# Patient Record
Sex: Male | Born: 1975 | Race: White | Hispanic: No | Marital: Married | Smoking: Never smoker
Health system: Southern US, Community
[De-identification: ages and names within clinical notes are randomized; demographics above are authoritative.]

## PROBLEM LIST (undated history)

## (undated) HISTORY — PX: CYST EXCISION: SHX5701

---

## 2006-06-25 HISTORY — PX: LAPAROSCOPIC GASTRIC BYPASS: SUR771

## 2007-06-26 DIAGNOSIS — L0291 Cutaneous abscess, unspecified: Secondary | ICD-10-CM

## 2007-06-26 HISTORY — DX: Cutaneous abscess, unspecified: L02.91

## 2010-01-25 ENCOUNTER — Ambulatory Visit (HOSPITAL_COMMUNITY): Admission: AD | Admit: 2010-01-25 | Discharge: 2010-01-26 | Payer: Self-pay | Admitting: Orthopedic Surgery

## 2010-09-08 LAB — BASIC METABOLIC PANEL
BUN: 8 mg/dL (ref 6–23)
CO2: 26 mEq/L (ref 19–32)
Calcium: 8.7 mg/dL (ref 8.4–10.5)
GFR calc non Af Amer: >60 mL/min (ref >60)
Glucose, Bld: 93 mg/dL (ref 70–99)

## 2010-09-08 LAB — CBC
HCT: 38.2 % — ABNORMAL LOW (ref 39.0–52.0)
MCH: 31.1 pg (ref 26.0–34.0)
MCHC: 35.3 g/dL (ref 30.0–36.0)
RDW: 11.9 % (ref 11.5–15.5)

## 2010-09-08 LAB — ANAEROBIC CULTURE

## 2010-09-08 LAB — DIFFERENTIAL
Basophils Absolute: 0 10*3/uL (ref 0.0–0.1)
Basophils Relative: 0 % (ref 0–1)
Eosinophils Absolute: 0.3 10*3/uL (ref 0.0–0.7)
Monocytes Absolute: 1.1 10*3/uL — ABNORMAL HIGH (ref 0.1–1.0)
Monocytes Relative: 11 % (ref 3–12)
Neutro Abs: 6.9 10*3/uL (ref 1.7–7.7)
Neutrophils Relative %: 70 % (ref 43–77)

## 2010-09-08 LAB — WOUND CULTURE

## 2010-09-08 LAB — GRAM STAIN

## 2010-09-08 LAB — SURGICAL PCR SCREEN: Staphylococcus aureus: NEGATIVE

## 2016-10-17 ENCOUNTER — Ambulatory Visit (INDEPENDENT_AMBULATORY_CARE_PROVIDER_SITE_OTHER): Payer: BLUE CROSS/BLUE SHIELD | Admitting: Orthopedic Surgery

## 2016-10-17 ENCOUNTER — Encounter (INDEPENDENT_AMBULATORY_CARE_PROVIDER_SITE_OTHER): Payer: Self-pay | Admitting: Orthopedic Surgery

## 2016-10-17 ENCOUNTER — Ambulatory Visit (INDEPENDENT_AMBULATORY_CARE_PROVIDER_SITE_OTHER): Payer: Self-pay

## 2016-10-17 DIAGNOSIS — M79672 Pain in left foot: Secondary | ICD-10-CM

## 2016-10-17 MED ORDER — DICLOFENAC SODIUM 2 % TD SOLN: 2.0000 | Freq: Two times a day (BID) | TRANSDERMAL | 1 refills | Status: DC

## 2016-10-17 MED ORDER — IBUPROFEN-FAMOTIDINE 800-26.6 MG PO TABS: 1.0000 | ORAL_TABLET | Freq: Two times a day (BID) | ORAL | 0 refills | Status: DC

## 2016-10-17 NOTE — Progress Notes (Signed)
Office Visit Note   Patient: Zachary Bates           Date of Birth: 07-28-1975           MRN: [id number] Visit Date: 10/17/2016 Requested by: No referring provider defined for this encounter. PCP: No PCP Per Patient  Subjective: No chief complaint on file.   HPI: Zachary Bates is a 41 year old patient with left foot pain.  Denies any history of injury.  Pain is been going on for a week.  Reports constant pain is worse with prolonged standing but also affects him when he is at rest.  At times it is tender to touch.  Denies much in the way of swelling.  He does have boots that he wears which are work boots which she has had for a year.  They're high-quality boots.  Put insoles in the boots about 6 months ago.  By taking any medication for the problem yet.  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              ROS: All systems reviewed are negative as they relate to the chief complaint within the history of present illness.  Patient denies  fevers or chills.   Assessment & Plan: Visit Diagnoses:  1. Pain in left foot     Plan: Impression is peroneal tendinitis at the attachment site to the base of fifth metatarsal.  This does not look like a stress reaction or stress fracture at this time based on location of tenderness and plain radiographs.  I would try him on Duexis for [redacted] weeks along with topical anti-inflammatory for about the same amount of time.  We'll see him back in 4 weeks if he is not better could consider MRI scanning at that time to rule out stress fracture  Follow-Up Instructions: No Follow-up on file.   Orders:  Orders Placed This Encounter  Procedures  . XR Foot Complete Left   Meds ordered this encounter  Medications  . Ibuprofen-Famotidine (DUEXIS) 800-26.6 MG TABS    Sig: Take 1 tablet by mouth 2 (two) times daily.    Dispense:  60 tablet    Refill:  0  . Diclofenac Sodium (PENNSAID) 2 % SOLN    Sig: Place 2 Squirts onto the skin 2 (two) times  daily.    Dispense:  1 Bottle    Refill:  1      Procedures: No procedures performed   Clinical Data: No additional findings.  Objective: Vital Signs: There were no vitals taken for this visit.  Physical Exam:   Constitutional: Patient appears well-developed HEENT:  Head: Normocephalic Eyes:EOM are normal Neck: Normal range of motion Cardiovascular: Normal rate Pulmonary/chest: Effort normal Neurologic: Patient is alert Skin: Skin is warm Psychiatric: Patient has normal mood and affect    Ortho Exam: Orthopedic exam demonstrates valgus alignment of the right lower extremity on the left the patient has palpable pedal pulses intact anterior to posterior tib peroneal and Achilles tendon with symmetric tibiotalar subtalar transverse tarsal range of motion.  Patient localizes pain at the base of fifth metatarsal.  This area is tender to palpation.  Peroneal tendon is palpable and intact.  No real discrete tenderness at the bone of the proximal fifth metatarsal itself.  Specialty Comments:  No specialty comments available.  Imaging: Xr Foot Complete Left  Result Date: 10/17/2016 3 views left foot reviewed.  AP lateral and oblique.  No fracture present.  Particularly  at the base of the fifth metatarsal.  No abnormal soft tissue calcifications.  Minimal degenerative changes within the midfoot.  Alignment at the tarsometatarsal articulation is intact    PMFS History: There are no active problems to display for this patient.  No past medical history on file.  No family history on file.  No past surgical history on file. Social History   Occupational History  . Not on file.   Social History Main Topics  . Smoking status: Never Smoker  . Smokeless tobacco: Never Used  . Alcohol use Not on file  . Drug use: Unknown  . Sexual activity: Not on file

## 2016-10-26 ENCOUNTER — Telehealth (INDEPENDENT_AMBULATORY_CARE_PROVIDER_SITE_OTHER): Payer: Self-pay | Admitting: Orthopedic Surgery

## 2016-10-26 NOTE — Telephone Encounter (Signed)
Tresa EndoKelly from [facility] called wanting to know the reason why the patients RX was prescribed. CB # [phone]

## 2016-10-26 NOTE — Telephone Encounter (Signed)
Called back with information

## 2016-11-15 ENCOUNTER — Ambulatory Visit (INDEPENDENT_AMBULATORY_CARE_PROVIDER_SITE_OTHER): Payer: BLUE CROSS/BLUE SHIELD | Admitting: Orthopedic Surgery

## 2016-11-28 ENCOUNTER — Ambulatory Visit (INDEPENDENT_AMBULATORY_CARE_PROVIDER_SITE_OTHER): Payer: BLUE CROSS/BLUE SHIELD | Admitting: Orthopedic Surgery

## 2016-11-28 ENCOUNTER — Encounter (INDEPENDENT_AMBULATORY_CARE_PROVIDER_SITE_OTHER): Payer: Self-pay | Admitting: Orthopedic Surgery

## 2016-11-28 DIAGNOSIS — M79672 Pain in left foot: Secondary | ICD-10-CM

## 2016-11-28 NOTE — Progress Notes (Signed)
   Office Visit Note   Patient: Zachary Bates           Date of Birth: December 30, 1975           MRN: [id number] Visit Date: 11/28/2016 Requested by: No referring provider defined for this encounter. PCP: Patient, No Pcp Per  Subjective: Chief Complaint  Patient presents with  . Left Foot - Follow-up    HPI: Zachary HuaDavid is a 41 year old patient with left foot pain.  Thought he had peroneal tendinitis last clinic visit.  Complains of pain at the end of the day after increased activity but to exercise has helped.  He has kept working.  Steps are okay for him.  He wears safety shoes at work.  He has been on topical anti-inflammatory.              ROS: All systems reviewed are negative as they relate to the chief complaint within the history of present illness.  Patient denies  fevers or chills.   Assessment & Plan: Visit Diagnoses:  1. Left foot pain     Plan: Impression is left foot improvement in peroneal tendon tendinitis.  Plan is to continue current conservative modalities of treatment.  Return as needed  Follow-Up Instructions: No Follow-up on file.   Orders:  No orders of the defined types were placed in this encounter.  No orders of the defined types were placed in this encounter.     Procedures: No procedures performed   Clinical Data: No additional findings.  Objective: Vital Signs: There were no vitals taken for this visit.  Physical Exam:   Constitutional: Patient appears well-developed HEENT:  Head: Normocephalic Eyes:EOM are normal Neck: Normal range of motion Cardiovascular: Normal rate Pulmonary/chest: Effort normal Neurologic: Patient is alert Skin: Skin is warm Psychiatric: Patient has normal mood and affect    Ortho Exam: Orthopedic exam demonstrates slight valgus alignment right leg versus left.  Patient has palpable pedal pulses.  Intact extensor mechanism.  Has good symmetric tibiotalar subtalar transverse tarsal range of motion on the right-hand  side with no tenderness to resisted eversion at the base of the fifth metatarsal.  Specialty Comments:  No specialty comments available.  Imaging: No results found.   PMFS History: There are no active problems to display for this patient.  No past medical history on file.  No family history on file.  No past surgical history on file. Social History   Occupational History  . Not on file.   Social History Main Topics  . Smoking status: Never Smoker  . Smokeless tobacco: Never Used  . Alcohol use Not on file  . Drug use: Unknown  . Sexual activity: Not on file

## 2018-11-21 ENCOUNTER — Ambulatory Visit: Payer: Self-pay | Admitting: Orthopedic Surgery

## 2018-12-05 ENCOUNTER — Telehealth: Payer: Self-pay

## 2018-12-05 ENCOUNTER — Ambulatory Visit: Payer: Self-pay

## 2018-12-05 ENCOUNTER — Encounter: Payer: Self-pay | Admitting: Orthopedic Surgery

## 2018-12-05 ENCOUNTER — Ambulatory Visit (INDEPENDENT_AMBULATORY_CARE_PROVIDER_SITE_OTHER): Payer: BC Managed Care – PPO | Admitting: Orthopedic Surgery

## 2018-12-05 ENCOUNTER — Other Ambulatory Visit: Payer: Self-pay

## 2018-12-05 DIAGNOSIS — M1711 Unilateral primary osteoarthritis, right knee: Secondary | ICD-10-CM | POA: Diagnosis not present

## 2018-12-05 DIAGNOSIS — M25561 Pain in right knee: Secondary | ICD-10-CM | POA: Diagnosis not present

## 2018-12-05 NOTE — Telephone Encounter (Signed)
Submitted VOB for SynviscOne, right knee. 

## 2018-12-05 NOTE — Telephone Encounter (Signed)
Can we get patient approved for right knee gel injection? Thanks.  

## 2018-12-05 NOTE — Telephone Encounter (Signed)
Noted  

## 2018-12-08 ENCOUNTER — Telehealth: Payer: Self-pay

## 2018-12-08 NOTE — Telephone Encounter (Signed)
Patient's insurance, [employer] [place] does not cover any gel injections.  Please advise on what the next option would be for the patient.  Thank you

## 2018-12-08 NOTE — Telephone Encounter (Signed)
IC LM VM for patient advising to return call to discuss options per Dr Marlou Sa.

## 2018-12-08 NOTE — Telephone Encounter (Signed)
Please advise. Thanks.  

## 2018-12-08 NOTE — Telephone Encounter (Signed)
Please call patient to inform him that his insurance does not cover gel injections.  So his options at this point would be living with it or getting the knee replacement

## 2018-12-09 ENCOUNTER — Encounter: Payer: Self-pay | Admitting: Orthopedic Surgery

## 2018-12-09 DIAGNOSIS — M1711 Unilateral primary osteoarthritis, right knee: Secondary | ICD-10-CM | POA: Diagnosis not present

## 2018-12-09 MED ORDER — METHYLPREDNISOLONE ACETATE 40 MG/ML IJ SUSP
40.0000 mg | INTRAMUSCULAR | Status: AC | PRN
  Administered 2018-12-09: 40 mg via INTRA_ARTICULAR

## 2018-12-09 MED ORDER — BUPIVACAINE HCL 0.25 % IJ SOLN
4.0000 mL | INTRAMUSCULAR | Status: AC | PRN
  Administered 2018-12-09: 4 mL via INTRA_ARTICULAR

## 2018-12-09 MED ORDER — LIDOCAINE HCL 1 % IJ SOLN
5.0000 mL | INTRAMUSCULAR | Status: AC | PRN
  Administered 2018-12-09: 5 mL

## 2018-12-09 NOTE — Progress Notes (Signed)
Office Visit Note   Patient: Zachary Bates           Date of Birth: 12/22/1975           MRN: [id number] Visit Date: 12/05/2018 Requested by: No referring provider defined for this encounter. PCP: Patient, No Pcp Per  Subjective: Chief Complaint  Patient presents with  . Right Knee - Pain    HPI: Uziah is a patient with right knee pain.  He reports pain for several months.  He does very difficult work at [employer] where he has to stand for 12-hour shifts.  He had gastric bypass in 2008.  He had a known history of some knee issues at that time.  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  He has had occasional sleep disturbance from the pain.  He describes good and bad days.              ROS: All systems reviewed are negative as they relate to the chief complaint within the history of present illness.  Patient denies  fevers or chills.   Assessment & Plan: Visit Diagnoses:  1. Right knee pain, unspecified chronicity   2. Arthritis of right knee     Plan: Impression is right knee arthritis worsening with valgus deformity.  Valgus deformity is mild to moderate at this time.  He is young but there is really no other option for him other than total knee replacement.  Risk and benefits are discussed.  He is going to consider his options.  We will try to get the gel shot approved for his knee but if that is not in the cards for him then we may need to proceed with total knee replacement.  Cortisone injection performed in the knee today.  6-week return to discuss further options.  Follow-Up Instructions: Return if symptoms worsen or fail to improve.   Orders:  Orders Placed This Encounter  Procedures  . XR Knee 1-2 Views Right   No orders of the defined types were placed in this encounter.     Procedures: Large Joint Inj: R knee on 12/09/2018 9:22 PM Indications: diagnostic evaluation, joint swelling and pain Details: 18 G 1.5 in needle, superolateral approach   Arthrogram: No  Medications: 5 mL lidocaine 1 %; 40 mg methylPREDNISolone acetate 40 MG/ML; 4 mL bupivacaine 0.25 % Outcome: tolerated well, no immediate complications Procedure, treatment alternatives, risks and benefits explained, specific risks discussed. Consent was given by the patient. Immediately prior to procedure a time out was called to verify the correct patient, procedure, equipment, support staff and site/side marked as required. Patient was prepped and draped in the usual sterile fashion.       Clinical Data: No additional findings.  Objective: Vital Signs: There were no vitals taken for this visit.  Physical Exam:   Constitutional: Patient appears well-developed HEENT:  Head: Normocephalic Eyes:EOM are normal Neck: Normal range of motion Cardiovascular: Normal rate Pulmonary/chest: Effort normal Neurologic: Patient is alert Skin: Skin is warm Psychiatric: Patient has normal mood and affect    Ortho Exam: Ortho exam demonstrates full active and passive range of motion of the left knee.  Right knee has valgus alignment with mild effusion.  Pedal pulses palpable.  Patient does achieve full extension and flexion past 90.  No groin pain with internal or external Tatian of the leg.  No other masses lymphadenopathy or skin changes noted in that right knee region  Specialty Comments:  No specialty comments  available.  Imaging: No results found.   PMFS History: There are no active problems to display for this patient.  History reviewed. No pertinent past medical history.  History reviewed. No pertinent family history.  History reviewed. No pertinent surgical history. Social History   Occupational History  . Not on file  Tobacco Use  . Smoking status: Never Smoker  . Smokeless tobacco: Never Used  Substance and Sexual Activity  . Alcohol use: Not on file  . Drug use: Not on file  . Sexual activity: Not on file

## 2019-01-16 ENCOUNTER — Encounter: Payer: Self-pay | Admitting: Orthopedic Surgery

## 2019-01-16 ENCOUNTER — Ambulatory Visit (INDEPENDENT_AMBULATORY_CARE_PROVIDER_SITE_OTHER): Payer: BC Managed Care – PPO | Admitting: Orthopedic Surgery

## 2019-01-16 DIAGNOSIS — M1711 Unilateral primary osteoarthritis, right knee: Secondary | ICD-10-CM | POA: Diagnosis not present

## 2019-01-16 MED ORDER — DICLOFENAC SODIUM 1.5 % TD SOLN: 1.0000 | TRANSDERMAL | 1 refills | Status: DC

## 2019-01-16 NOTE — Progress Notes (Signed)
   Office Visit Note   Patient: Zachary Bates           Date of Birth: 1976-01-22           MRN: [id number] Visit Date: 01/16/2019 Requested by: No referring provider defined for this encounter. PCP: Patient, No Pcp Per  Subjective: Chief Complaint  Patient presents with  . Right Knee - Follow-up    HPI: Zachary Bates is a 43 y.o. male who presents to the office complaining of R knee pain.  Pt is following up after injection on 12/05/18. He states he has had ~50% relief from injection.  He still experiences pain following long 12 hour days at his job, working for [employer].  He has not tried any medications or other treatments besides a knee sleeve and topical NSAID cream with some relief.  His pain does not wake him up from sleep.  Pt states his only medical history is a gastric bypass in the past.                ROS: All systems reviewed are negative as they relate to the chief complaint within the history of present illness.  Patient denies  fevers or chills.   Assessment & Plan: Visit Diagnoses: No diagnosis found.  Plan: Pt is a 43 y.o. Male with a history of R knee pain.  He had some relief from his last injection but it is too soon for another cortisone injection.  The gel injections were denied by insurance.  He is not able to take a course of oral anti-inflammatories due to his history of gastric bypass.  Discussed options with the patient and we agreed on a presciption for Pennsaid topical.  Pt will f/u with the office prn.    Follow-Up Instructions: No follow-ups on file.   Orders:  No orders of the defined types were placed in this encounter.  No orders of the defined types were placed in this encounter.     Procedures: No procedures performed   Clinical Data: No additional findings.  Objective: Vital Signs: There were no vitals taken for this visit.  Physical Exam:   Constitutional: Patient appears well-developed HEENT:  Head: Normocephalic Eyes:EOM are  normal Neck: Normal range of motion Cardiovascular: Normal rate Pulmonary/chest: Effort normal Neurologic: Patient is alert Skin: Skin is warm Psychiatric: Patient has normal mood and affect    Ortho Exam:  Right knee exam Valgus deformity No effusion Extensor mechanism intact No TTP over the medial or lateral jointlines, quad tendon, patellar tendon, pes anserinus, patella, tibial tubercle, LCL/MCL insertions Stable to varus/valgus stresses.  Stable to anterior/posterior drawer Extension to 0 degrees Flexion > 90 degrees   Specialty Comments:  No specialty comments available.  Imaging: No results found.   PMFS History: There are no active problems to display for this patient.  History reviewed. No pertinent past medical history.  History reviewed. No pertinent family history.  History reviewed. No pertinent surgical history. Social History   Occupational History  . Not on file  Tobacco Use  . Smoking status: Never Smoker  . Smokeless tobacco: Never Used  Substance and Sexual Activity  . Alcohol use: Not on file  . Drug use: Not on file  . Sexual activity: Not on file

## 2019-01-19 ENCOUNTER — Encounter: Payer: Self-pay | Admitting: Orthopedic Surgery

## 2020-04-26 ENCOUNTER — Telehealth: Payer: Self-pay

## 2020-04-26 NOTE — Telephone Encounter (Signed)
See below

## 2020-04-26 NOTE — Telephone Encounter (Signed)
Patient wife came in he wants to get pre approval for gel injection for his appt. Which is on 11/12. Call back:[phone]

## 2020-04-27 NOTE — Telephone Encounter (Signed)
Talked with patient's wife Victorino Dike to verify patient's insurance.  Per Victorino Dike, patient still has [employer] and patient's insurance does not cover for gel injections.  Quincy Sheehan and she voiced that she understands.

## 2020-05-06 ENCOUNTER — Ambulatory Visit: Payer: BC Managed Care – PPO | Admitting: Orthopedic Surgery

## 2020-05-06 ENCOUNTER — Ambulatory Visit: Payer: Self-pay

## 2020-05-06 DIAGNOSIS — G8929 Other chronic pain: Secondary | ICD-10-CM

## 2020-05-06 DIAGNOSIS — M25561 Pain in right knee: Secondary | ICD-10-CM

## 2020-05-07 ENCOUNTER — Encounter: Payer: Self-pay | Admitting: Orthopedic Surgery

## 2020-05-07 NOTE — Progress Notes (Signed)
Office Visit Note   Patient: Zachary Bates [facility]           Date of Birth: January 27, 1976           MRN: [id number] Visit Date: 05/06/2020 Requested by: No referring provider defined for this encounter. PCP: Patient, No Pcp Per  Subjective: Chief Complaint  Patient presents with  . Right Knee - Pain    HPI: Robt is a 44 year old patient with end-stage right knee arthritis.  Continues to have pain in his knee.  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  He was considering gel injections but insurance would not cover it.  He also reports having a leg rash since being in the woods.  Initially started out as a ring of small little chigger bite appearing lesions and they spread up the leg.  He also has a cavity which she wants to get treated prior to any type of intervention.  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  Also has a very remote history of MRSA which was by report treated successfully and eradicated at [facility].  His knee pain is debilitating and significant.  Has night pain and rest pain but typically soldiers on.              ROS: All systems reviewed are negative as they relate to the chief complaint within the history of present illness.  Patient denies  fevers or chills.   Assessment & Plan: Visit Diagnoses:  1. Chronic pain of right knee     Plan: Impression is right knee severe arthritis.  Patient has night pain rest pain and pain which interferes with activities of daily living.  We discussed knee replacement as an option.  Even though he is young his quality of life is being affected to the degree that he would like to proceed with knee replacement.  The risk and benefits of knee replacement are discussed include not limited to infection nerve vessel damage potential need for revision in his lifetime.  I would like to attempt press-fit prosthesis but the magnitude of his valgus deformity may require more constraint which is  only available in cemented components.  Patient understands the risk and benefits of surgery.  Do anticipate that we could correct his valgus deformity to  Less than 10 degrees.  Patient understands the risk and benefits and wishes to proceed.  He will undergo medical evaluation for these bites which have persisted since August.  None of them appear infectious but it is slightly atypical that they have persisted for so long.  He also states he has a cavity and I would like to have him get that addressed also before knee replacement.   Follow-Up Instructions: No follow-ups on file.   Orders:  Orders Placed This Encounter  Procedures  . XR KNEE 3 VIEW RIGHT   No orders of the defined types were placed in this encounter.     Procedures: No procedures performed   Clinical Data: No additional findings.  Objective: Vital Signs: There were no vitals taken for this visit.  Physical Exam:  Constitutional: Patient appears well-developed HEENT:  Head: Normocephalic Eyes:EOM are normal Neck: Normal range of motion Cardiovascular: Normal rate Pulmonary/chest: Effort normal Neurologic: Patient is alert Skin: Skin is warm Psychiatric: Patient has normal mood and affect   Ortho Exam: Ortho exam demonstrates valgus alignment right lower extremity.  Does achieve full extension and  flexes to about 100 degrees.  His valgus deformity is minimally correctable in extension and we can correct him about 5 degrees and 30 degrees of flexion.  Extensor mechanism is intact.  Pedal pulses palpable.  Ankle dorsiflexion intact.  Does have on both legs what appears to be multiple trigger bites none of which are acutely inflamed.  They are nontender.  None of them are in line with any type of incision on the right knee.  Specialty Comments:  No specialty comments available.  Imaging: No results found.   PMFS History: There are no problems to display for this patient.  No past medical history on file.   No family history on file.  No past surgical history on file. Social History   Occupational History  . Not on file  Tobacco Use  . Smoking status: Never Smoker  . Smokeless tobacco: Never Used  Substance and Sexual Activity  . Alcohol use: Not on file  . Drug use: Not on file  . Sexual activity: Not on file

## 2020-05-10 ENCOUNTER — Other Ambulatory Visit: Payer: Self-pay

## 2020-05-26 NOTE — Progress Notes (Signed)
[id number] [place], [place] - [address] [place] [address] Phone: [phone] Fax: [phone]  [facility] - [place], [place] - [place] [address] [place] [address] Phone: [phone] Fax: [phone]      Your procedure is scheduled on Tuesday 05/31/2020.  Report to [facility] Main Entrance "A" at 05:30 A.M., and check in at the Admitting office.  Call this number if you have problems the morning of surgery:  [phone]  Call [phone] if you have any questions prior to your surgery date Monday-Friday 8am-4pm    Remember:  Do not eat after midnight the night before your surgery  You may drink clear liquids until 04:30 the morning of your surgery.   Clear liquids allowed are: Water, Non-Citrus Juices (without pulp), Carbonated Beverages, Clear Tea, Black Coffee Only, and Gatorade  Enhanced Recovery after Surgery for Orthopedics Enhanced Recovery after Surgery is a protocol used to improve the stress on your body and your recovery after surgery.  Patient Instructions  . The night before surgery:  o No food after midnight. ONLY clear liquids after midnight  .  Marland Kitchen The day of surgery (if you do NOT have diabetes):  o Drink ONE (1) Pre-Surgery Clear Ensure by 04:30 am the morning of surgery   o This drink was given to you during your hospital  pre-op appointment visit.  o Nothing else to drink after completing the  Pre-Surgery Clear Ensure.         If you have questions, please contact your surgeon's office.     Take these medicines the morning of surgery with A SIP OF WATER: Acetaminophen (Tylenol) - if needed   As of today, STOP taking any Diclofenac Sodium, Aspirin (unless otherwise instructed by your surgeon) or aspirin-containing products, Aleve, Naproxen, Ibuprofen, Motrin, Advil, Goody's, BC's, all herbal medications, fish oil, and all vitamins.                      Do not wear jewelry            Do  not wear lotions, powders, colognes, or deodorant.            Do not shave 48 hours prior to surgery.  Men may shave face and neck.            Do not bring valuables to the hospital.            [facility] is not responsible for any belongings or valuables.  Do NOT Smoke (Tobacco/Vaping) or drink Alcohol 24 hours prior to your procedure  If you use a CPAP at night, you may bring all equipment for your overnight stay.   Contacts, glasses, dentures or bridgework may not be worn into surgery.      For patients admitted to the hospital, discharge time will be determined by your treatment team.   Patients discharged the day of surgery will not be allowed to drive home, and someone needs to stay with them for 24 hours.    Special instructions:   [place]- Preparing For Surgery  Before surgery, you can play an important role. Because skin is not sterile, your skin needs to be as free of germs as possible. You can reduce the number of germs on your skin by washing with CHG (chlorahexidine gluconate) Soap before surgery.  CHG is an antiseptic cleaner which kills germs and bonds with the skin to continue  killing germs even after washing.    Oral Hygiene is also important to reduce your risk of infection.  Remember - BRUSH YOUR TEETH THE MORNING OF SURGERY WITH YOUR REGULAR TOOTHPASTE  Please do not use if you have an allergy to CHG or antibacterial soaps. If your skin becomes reddened/irritated stop using the CHG.  Do not shave (including legs and underarms) for at least 48 hours prior to first CHG shower. It is OK to shave your face.  Please follow these instructions carefully.   1. Shower the NIGHT BEFORE SURGERY and the MORNING OF SURGERY with CHG Soap.   2. If you chose to wash your hair, wash your hair first as usual with your normal shampoo.  3. After you shampoo, rinse your hair and body thoroughly to remove the shampoo.  4. Use CHG as you would any other liquid soap. You can apply  CHG directly to the skin and wash gently with a scrungie or a clean washcloth.   5. Apply the CHG Soap to your body ONLY FROM THE NECK DOWN.  Do not use on open wounds or open sores. Avoid contact with your eyes, ears, mouth and genitals (private parts). Wash Face and genitals (private parts)  with your normal soap.   6. Wash thoroughly, paying special attention to the area where your surgery will be performed.  7. Thoroughly rinse your body with warm water from the neck down.  8. DO NOT shower/wash with your normal soap after using and rinsing off the CHG Soap.  9. Pat yourself dry with a CLEAN TOWEL.  10. Wear CLEAN PAJAMAS to bed the night before surgery  11. Place CLEAN SHEETS on your bed the night of your first shower and DO NOT SLEEP WITH PETS.   Day of Surgery: Shower with CHG soap as directed Wear Clean/Comfortable clothing the morning of surgery Do not apply any deodorants/lotions.   Remember to brush your teeth WITH YOUR REGULAR TOOTHPASTE.   Please read over the following fact sheets that you were given.

## 2020-05-27 ENCOUNTER — Encounter (HOSPITAL_COMMUNITY)
Admission: RE | Admit: 2020-05-27 | Discharge: 2020-05-27 | Disposition: A | Discharge status: Home / Self Care | Payer: BC Managed Care – PPO | Source: Ambulatory Visit | Attending: Orthopedic Surgery | Admitting: Orthopedic Surgery

## 2020-05-27 ENCOUNTER — Encounter (HOSPITAL_COMMUNITY): Payer: Self-pay

## 2020-05-27 ENCOUNTER — Other Ambulatory Visit: Payer: Self-pay

## 2020-05-27 ENCOUNTER — Other Ambulatory Visit (HOSPITAL_COMMUNITY)
Admission: RE | Admit: 2020-05-27 | Discharge: 2020-05-27 | Disposition: A | Discharge status: Home / Self Care | Payer: BC Managed Care – PPO | Source: Ambulatory Visit | Attending: Orthopedic Surgery | Admitting: Orthopedic Surgery

## 2020-05-27 DIAGNOSIS — Z01812 Encounter for preprocedural laboratory examination: Secondary | ICD-10-CM | POA: Insufficient documentation

## 2020-05-27 DIAGNOSIS — Z20822 Contact with and (suspected) exposure to covid-19: Secondary | ICD-10-CM | POA: Diagnosis not present

## 2020-05-27 LAB — CBC
HCT: 45.9 % (ref 39.0–52.0)
Hemoglobin: 14.9 g/dL (ref 13.0–17.0)
MCH: 29.5 pg (ref 26.0–34.0)
MCHC: 32.5 g/dL (ref 30.0–36.0)
MCV: 90.9 fL (ref 80.0–100.0)
Platelets: 273 10*3/uL (ref 150–400)
RBC: 5.05 MIL/uL (ref 4.22–5.81)
RDW: 12.6 % (ref 11.5–15.5)
WBC: 6.7 10*3/uL (ref 4.0–10.5)
nRBC: 0 % (ref 0.0–0.2)

## 2020-05-27 LAB — BASIC METABOLIC PANEL
Anion gap: 8 (ref 5–15)
BUN: 11 mg/dL (ref 6–20)
CO2: 28 mmol/L (ref 22–32)
Calcium: 9.2 mg/dL (ref 8.9–10.3)
Chloride: 102 mmol/L (ref 98–111)
Creatinine, Ser: 0.79 mg/dL (ref 0.61–1.24)
GFR, Estimated: >60 mL/min (ref >60)
Glucose, Bld: 89 mg/dL (ref 70–99)
Potassium: 4.8 mmol/L (ref 3.5–5.1)
Sodium: 138 mmol/L (ref 135–145)

## 2020-05-27 LAB — URINALYSIS, ROUTINE W REFLEX MICROSCOPIC
Bilirubin Urine: NEGATIVE
Glucose, UA: NEGATIVE mg/dL
Hgb urine dipstick: NEGATIVE
Ketones, ur: NEGATIVE mg/dL
Leukocytes,Ua: NEGATIVE
Nitrite: NEGATIVE
Protein, ur: NEGATIVE mg/dL
Specific Gravity, Urine: 1.016 (ref 1.005–1.030)
pH: 7 (ref 5.0–8.0)

## 2020-05-27 LAB — SARS CORONAVIRUS 2 (TAT 6-24 HRS): SARS Coronavirus 2: NEGATIVE

## 2020-05-27 LAB — SURGICAL PCR SCREEN
MRSA, PCR: NEGATIVE
Staphylococcus aureus: NEGATIVE

## 2020-05-27 NOTE — Progress Notes (Signed)
PCP -  Judeen Hammans FNP in [place], [place] Cardiologist - Denies  Chest x-ray -Not indicated  EKG - Not indicated Stress Test - Denies ECHO - Denies Cardiac Cath - Denies  Sleep Study - Years ago, diagnosed with OSA then lost weight no longer uses CPAP or has trouble CPAP - NO  DM - Denies  ERAS Protcol -Yes PRE-SURGERY Ensure given   COVID TEST- 05/27/20  Anesthesia review: No  Patient denies shortness of breath, fever, cough and chest pain at PAT appointment   All instructions explained to the patient, with a verbal understanding of the material. Patient agrees to go over the instructions while at home for a better understanding. Patient also instructed to self quarantine after being tested for COVID-19. The opportunity to ask questions was provided.

## 2020-05-28 ENCOUNTER — Other Ambulatory Visit (HOSPITAL_COMMUNITY): Payer: BC Managed Care – PPO

## 2020-05-29 LAB — URINE CULTURE: Culture: NO GROWTH

## 2020-05-30 MED ORDER — TRANEXAMIC ACID 1000 MG/10ML IV SOLN
2000.0000 mg | INTRAVENOUS | Status: AC
  Administered 2020-05-31: 2000 mg via TOPICAL
  Filled 2020-05-30: qty 20

## 2020-05-30 NOTE — Anesthesia Preprocedure Evaluation (Addendum)
Anesthesia Evaluation  Patient identified by MRN, date of birth, ID band Patient awake    Reviewed: Allergy & Precautions, NPO status , Patient's Chart, lab work & pertinent test results  History of Anesthesia Complications Negative for: history of anesthetic complications  Airway Mallampati: II  TM Distance: >3 FB Neck ROM: Full    Dental  (+) Dental Advisory Given   Pulmonary neg pulmonary ROS,  05/27/2020 SARS coronavirus NEG   breath sounds clear to auscultation       Cardiovascular negative cardio ROS   Rhythm:Regular Rate:Normal     Neuro/Psych negative neurological ROS     GI/Hepatic Neg liver ROS, neg GERD  ,H/o gastric bypass   Endo/Other  Morbid obesity  Renal/GU negative Renal ROS     Musculoskeletal  (+) Arthritis ,   Abdominal (+) + obese,   Peds  Hematology negative hematology ROS (+)   Anesthesia Other Findings   Reproductive/Obstetrics                            Anesthesia Physical Anesthesia Plan  ASA: II  Anesthesia Plan: Spinal   Post-op Pain Management:  Regional for Post-op pain   Induction:   PONV Risk Score and Plan: 1 and Ondansetron  Airway Management Planned: Natural Airway and Simple Face Mask  Additional Equipment: None  Intra-op Plan:   Post-operative Plan:   Informed Consent: I have reviewed the patients History and Physical, chart, labs and discussed the procedure including the risks, benefits and alternatives for the proposed anesthesia with the patient or authorized representative who has indicated his/her understanding and acceptance.     Dental advisory given  Plan Discussed with: Surgeon and CRNA  Anesthesia Plan Comments: (Plan routine monitors, SAB with adductor canal block for post op analgesia)       Anesthesia Quick Evaluation

## 2020-05-31 ENCOUNTER — Observation Stay (HOSPITAL_COMMUNITY)
Admission: RE | Admit: 2020-05-31 | Discharge: 2020-06-02 | Disposition: A | Discharge status: Home / Self Care | Payer: BC Managed Care – PPO | Attending: Orthopedic Surgery | Admitting: Orthopedic Surgery

## 2020-05-31 ENCOUNTER — Encounter (HOSPITAL_COMMUNITY)
Admission: RE | Disposition: A | Discharge status: Home / Self Care | Payer: Self-pay | Source: Home / Self Care | Attending: Orthopedic Surgery

## 2020-05-31 ENCOUNTER — Encounter (HOSPITAL_COMMUNITY): Payer: Self-pay | Admitting: Orthopedic Surgery

## 2020-05-31 ENCOUNTER — Ambulatory Visit (HOSPITAL_COMMUNITY): Payer: BC Managed Care – PPO | Admitting: Certified Registered Nurse Anesthetist

## 2020-05-31 ENCOUNTER — Observation Stay (HOSPITAL_COMMUNITY): Payer: BC Managed Care – PPO

## 2020-05-31 ENCOUNTER — Other Ambulatory Visit: Payer: Self-pay

## 2020-05-31 DIAGNOSIS — M1711 Unilateral primary osteoarthritis, right knee: Secondary | ICD-10-CM | POA: Diagnosis not present

## 2020-05-31 DIAGNOSIS — Z96651 Presence of right artificial knee joint: Secondary | ICD-10-CM

## 2020-05-31 HISTORY — PX: TOTAL KNEE ARTHROPLASTY: SHX125

## 2020-05-31 SURGERY — ARTHROPLASTY, KNEE, TOTAL
Anesthesia: Spinal | Site: Knee | Laterality: Right

## 2020-05-31 MED ORDER — METHOCARBAMOL 500 MG PO TABS
ORAL_TABLET | ORAL | Status: AC
  Filled 2020-05-31: qty 1

## 2020-05-31 MED ORDER — POVIDONE-IODINE 7.5 % EX SOLN
Freq: Once | CUTANEOUS | Status: DC
  Filled 2020-05-31: qty 118

## 2020-05-31 MED ORDER — OXYCODONE HCL 5 MG PO TABS
5.0000 mg | ORAL_TABLET | Freq: Once | ORAL | Status: AC | PRN
  Administered 2020-05-31: 5 mg via ORAL

## 2020-05-31 MED ORDER — ONDANSETRON HCL 4 MG/2ML IJ SOLN
INTRAMUSCULAR | Status: DC | PRN
  Administered 2020-05-31: 4 mg via INTRAVENOUS

## 2020-05-31 MED ORDER — BUPIVACAINE IN DEXTROSE 0.75-8.25 % IT SOLN
INTRATHECAL | Status: DC | PRN
  Administered 2020-05-31: 15 mg via INTRATHECAL

## 2020-05-31 MED ORDER — CEFAZOLIN SODIUM-DEXTROSE 2-4 GM/100ML-% IV SOLN
2.0000 g | Freq: Three times a day (TID) | INTRAVENOUS | Status: AC
  Administered 2020-05-31 (×2): 2 g via INTRAVENOUS
  Filled 2020-05-31 (×2): qty 100

## 2020-05-31 MED ORDER — PROPOFOL 10 MG/ML IV BOLUS
INTRAVENOUS | Status: AC
  Filled 2020-05-31: qty 20

## 2020-05-31 MED ORDER — BUPIVACAINE HCL 0.25 % IJ SOLN
INTRAMUSCULAR | Status: DC | PRN
  Administered 2020-05-31: 30 mL

## 2020-05-31 MED ORDER — ACETAMINOPHEN 500 MG PO TABS
1000.0000 mg | ORAL_TABLET | Freq: Once | ORAL | Status: DC
  Filled 2020-05-31: qty 2

## 2020-05-31 MED ORDER — POVIDONE-IODINE 10 % EX SWAB: 2.0000 "application " | Freq: Once | CUTANEOUS | Status: DC

## 2020-05-31 MED ORDER — BUPIVACAINE LIPOSOME 1.3 % IJ SUSP
20.0000 mL | INTRAMUSCULAR | Status: AC
  Administered 2020-05-31: 20 mL
  Filled 2020-05-31: qty 20

## 2020-05-31 MED ORDER — PROMETHAZINE HCL 25 MG/ML IJ SOLN: 6.2500 mg | INTRAMUSCULAR | Status: DC | PRN

## 2020-05-31 MED ORDER — MENTHOL 3 MG MT LOZG: 1.0000 | LOZENGE | OROMUCOSAL | Status: DC | PRN

## 2020-05-31 MED ORDER — PHENYLEPHRINE HCL-NACL 10-0.9 MG/250ML-% IV SOLN
INTRAVENOUS | Status: DC | PRN
  Administered 2020-05-31: 20 ug/min via INTRAVENOUS

## 2020-05-31 MED ORDER — CLONIDINE HCL (ANALGESIA) 100 MCG/ML EP SOLN
EPIDURAL | Status: AC
  Filled 2020-05-31: qty 10

## 2020-05-31 MED ORDER — ASPIRIN 81 MG PO CHEW
81.0000 mg | CHEWABLE_TABLET | Freq: Two times a day (BID) | ORAL | Status: DC
  Administered 2020-05-31 – 2020-06-02 (×4): 81 mg via ORAL
  Filled 2020-05-31 (×4): qty 1

## 2020-05-31 MED ORDER — TRANEXAMIC ACID-NACL 1000-0.7 MG/100ML-% IV SOLN
1000.0000 mg | INTRAVENOUS | Status: AC
  Administered 2020-05-31: 1000 mg via INTRAVENOUS
  Filled 2020-05-31: qty 100

## 2020-05-31 MED ORDER — LACTATED RINGERS IV SOLN: INTRAVENOUS | Status: DC

## 2020-05-31 MED ORDER — CLONIDINE HCL (ANALGESIA) 100 MCG/ML EP SOLN
EPIDURAL | Status: DC | PRN
  Administered 2020-05-31: 100 ug

## 2020-05-31 MED ORDER — METHOCARBAMOL 500 MG PO TABS
500.0000 mg | ORAL_TABLET | Freq: Four times a day (QID) | ORAL | Status: DC | PRN
  Administered 2020-05-31 – 2020-06-02 (×6): 500 mg via ORAL
  Filled 2020-05-31 (×5): qty 1

## 2020-05-31 MED ORDER — FENTANYL CITRATE (PF) 250 MCG/5ML IJ SOLN
INTRAMUSCULAR | Status: DC | PRN
  Administered 2020-05-31: 50 ug via INTRAVENOUS
  Administered 2020-05-31: 25 ug via INTRAVENOUS

## 2020-05-31 MED ORDER — OXYCODONE HCL 5 MG PO TABS
ORAL_TABLET | ORAL | Status: AC
  Filled 2020-05-31: qty 1

## 2020-05-31 MED ORDER — VANCOMYCIN HCL 1000 MG IV SOLR
INTRAVENOUS | Status: AC
  Filled 2020-05-31: qty 1000

## 2020-05-31 MED ORDER — BUPIVACAINE HCL (PF) 0.25 % IJ SOLN
INTRAMUSCULAR | Status: AC
  Filled 2020-05-31: qty 30

## 2020-05-31 MED ORDER — PROPOFOL 500 MG/50ML IV EMUL
INTRAVENOUS | Status: DC | PRN
  Administered 2020-05-31: 50 ug/kg/min via INTRAVENOUS

## 2020-05-31 MED ORDER — MEPERIDINE HCL 25 MG/ML IJ SOLN: 6.2500 mg | INTRAMUSCULAR | Status: DC | PRN

## 2020-05-31 MED ORDER — LACTATED RINGERS IV SOLN: INTRAVENOUS | Status: DC | PRN

## 2020-05-31 MED ORDER — ROPIVACAINE HCL 7.5 MG/ML IJ SOLN
INTRAMUSCULAR | Status: DC | PRN
  Administered 2020-05-31: 20 mL via PERINEURAL

## 2020-05-31 MED ORDER — CHLORHEXIDINE GLUCONATE 0.12 % MT SOLN
OROMUCOSAL | Status: AC
  Administered 2020-05-31: 15 mL
  Filled 2020-05-31: qty 15

## 2020-05-31 MED ORDER — OXYCODONE HCL 5 MG PO TABS
5.0000 mg | ORAL_TABLET | ORAL | Status: DC | PRN
  Administered 2020-05-31 – 2020-06-02 (×7): 10 mg via ORAL
  Administered 2020-06-02: 5 mg via ORAL
  Administered 2020-06-02 (×2): 10 mg via ORAL
  Filled 2020-05-31 (×11): qty 2

## 2020-05-31 MED ORDER — OXYCODONE HCL 5 MG/5ML PO SOLN: 5.0000 mg | Freq: Once | ORAL | Status: AC | PRN

## 2020-05-31 MED ORDER — CELECOXIB 200 MG PO CAPS
200.0000 mg | ORAL_CAPSULE | Freq: Two times a day (BID) | ORAL | Status: DC
  Administered 2020-05-31 – 2020-06-02 (×4): 200 mg via ORAL
  Filled 2020-05-31 (×4): qty 1

## 2020-05-31 MED ORDER — SODIUM CHLORIDE 0.9% FLUSH
INTRAVENOUS | Status: DC | PRN
  Administered 2020-05-31 (×2): 10 mL

## 2020-05-31 MED ORDER — ONDANSETRON HCL 4 MG/2ML IJ SOLN: 4.0000 mg | Freq: Four times a day (QID) | INTRAMUSCULAR | Status: DC | PRN

## 2020-05-31 MED ORDER — PHENOL 1.4 % MT LIQD: 1.0000 | OROMUCOSAL | Status: DC | PRN

## 2020-05-31 MED ORDER — LIDOCAINE 2% (20 MG/ML) 5 ML SYRINGE
INTRAMUSCULAR | Status: DC | PRN
  Administered 2020-05-31: 80 mg via INTRAVENOUS

## 2020-05-31 MED ORDER — HYDROMORPHONE HCL 1 MG/ML IJ SOLN: 0.2500 mg | INTRAMUSCULAR | Status: DC | PRN

## 2020-05-31 MED ORDER — MORPHINE SULFATE (PF) 4 MG/ML IV SOLN
INTRAVENOUS | Status: DC | PRN
  Administered 2020-05-31: 4 mg

## 2020-05-31 MED ORDER — SODIUM CHLORIDE 0.9 % IR SOLN
Status: DC | PRN
  Administered 2020-05-31: 3000 mL

## 2020-05-31 MED ORDER — DOCUSATE SODIUM 100 MG PO CAPS
100.0000 mg | ORAL_CAPSULE | Freq: Two times a day (BID) | ORAL | Status: DC
  Administered 2020-05-31 – 2020-06-02 (×4): 100 mg via ORAL
  Filled 2020-05-31 (×4): qty 1

## 2020-05-31 MED ORDER — METOCLOPRAMIDE HCL 5 MG/ML IJ SOLN: 5.0000 mg | Freq: Three times a day (TID) | INTRAMUSCULAR | Status: DC | PRN

## 2020-05-31 MED ORDER — ACETAMINOPHEN 325 MG PO TABS: 325.0000 mg | ORAL_TABLET | Freq: Four times a day (QID) | ORAL | Status: DC | PRN

## 2020-05-31 MED ORDER — CEFAZOLIN SODIUM-DEXTROSE 2-4 GM/100ML-% IV SOLN
2.0000 g | INTRAVENOUS | Status: AC
  Administered 2020-05-31: 2 g via INTRAVENOUS
  Filled 2020-05-31: qty 100

## 2020-05-31 MED ORDER — MIDAZOLAM HCL 2 MG/2ML IJ SOLN: 0.5000 mg | Freq: Once | INTRAMUSCULAR | Status: DC | PRN

## 2020-05-31 MED ORDER — ONDANSETRON HCL 4 MG PO TABS: 4.0000 mg | ORAL_TABLET | Freq: Four times a day (QID) | ORAL | Status: DC | PRN

## 2020-05-31 MED ORDER — METHOCARBAMOL 1000 MG/10ML IJ SOLN
500.0000 mg | Freq: Four times a day (QID) | INTRAVENOUS | Status: DC | PRN
  Filled 2020-05-31: qty 5

## 2020-05-31 MED ORDER — METOCLOPRAMIDE HCL 5 MG PO TABS: 5.0000 mg | ORAL_TABLET | Freq: Three times a day (TID) | ORAL | Status: DC | PRN

## 2020-05-31 MED ORDER — 0.9 % SODIUM CHLORIDE (POUR BTL) OPTIME
TOPICAL | Status: DC | PRN
  Administered 2020-05-31: 1000 mL

## 2020-05-31 MED ORDER — MIDAZOLAM HCL 5 MG/5ML IJ SOLN
INTRAMUSCULAR | Status: DC | PRN
  Administered 2020-05-31: 2 mg via INTRAVENOUS

## 2020-05-31 MED ORDER — IRRISEPT - 450ML BOTTLE WITH 0.05% CHG IN STERILE WATER, USP 99.95% OPTIME
TOPICAL | Status: DC | PRN
  Administered 2020-05-31: 450 mL via TOPICAL

## 2020-05-31 MED ORDER — MORPHINE SULFATE (PF) 4 MG/ML IV SOLN
INTRAVENOUS | Status: AC
  Filled 2020-05-31: qty 2

## 2020-05-31 MED ORDER — FENTANYL CITRATE (PF) 250 MCG/5ML IJ SOLN
INTRAMUSCULAR | Status: AC
  Filled 2020-05-31: qty 5

## 2020-05-31 MED ORDER — HYDROMORPHONE HCL 1 MG/ML IJ SOLN
0.5000 mg | INTRAMUSCULAR | Status: DC | PRN
  Administered 2020-05-31 – 2020-06-01 (×5): 0.5 mg via INTRAVENOUS
  Filled 2020-05-31 (×6): qty 0.5

## 2020-05-31 MED ORDER — MIDAZOLAM HCL 2 MG/2ML IJ SOLN
INTRAMUSCULAR | Status: AC
  Filled 2020-05-31: qty 2

## 2020-05-31 SURGICAL SUPPLY — 84 items
BAG DECANTER FOR FLEXI CONT (MISCELLANEOUS) ×3 IMPLANT
BANDAGE ESMARK 6X9 LF (GAUZE/BANDAGES/DRESSINGS) ×1 IMPLANT
BLADE SAG 18X100X1.27 (BLADE) ×3 IMPLANT
BNDG COHESIVE 6X5 TAN STRL LF (GAUZE/BANDAGES/DRESSINGS) ×3 IMPLANT
BNDG ELASTIC 6X10 VLCR STRL LF (GAUZE/BANDAGES/DRESSINGS) ×3 IMPLANT
BNDG ELASTIC 6X15 VLCR STRL LF (GAUZE/BANDAGES/DRESSINGS) ×3 IMPLANT
BNDG ESMARK 6X9 LF (GAUZE/BANDAGES/DRESSINGS) ×3
BOWL SMART MIX CTS (DISPOSABLE) IMPLANT
CLOSURE STERI-STRIP 1/2X4 (GAUZE/BANDAGES/DRESSINGS) ×1
CLOSURE WOUND 1/2 X4 (GAUZE/BANDAGES/DRESSINGS) ×2
CLSR STERI-STRIP ANTIMIC 1/2X4 (GAUZE/BANDAGES/DRESSINGS) ×2 IMPLANT
CNTNR URN SCR LID CUP LEK RST (MISCELLANEOUS) ×1 IMPLANT
COMP FEM POR CR SZ 10 RT (Joint) ×3 IMPLANT
COMP PATELLAR 10X35 METAL (Joint) ×3 IMPLANT
COMPONENT FEM POR CR SZ 10 RT (Joint) ×1 IMPLANT
COMPONENT PATELLAR 10X35 METAL (Joint) ×1 IMPLANT
CONT SPEC 4OZ STRL OR WHT (MISCELLANEOUS) ×2
COVER SURGICAL LIGHT HANDLE (MISCELLANEOUS) ×3 IMPLANT
COVER WAND RF STERILE (DRAPES) ×3 IMPLANT
CUFF TOURN SGL QUICK 34 (TOURNIQUET CUFF) ×2
CUFF TOURN SGL QUICK 42 (TOURNIQUET CUFF) IMPLANT
CUFF TRNQT CYL 34X4.125X (TOURNIQUET CUFF) ×1 IMPLANT
DECANTER SPIKE VIAL GLASS SM (MISCELLANEOUS) ×3 IMPLANT
DRAPE INCISE IOBAN 66X45 STRL (DRAPES) IMPLANT
DRAPE ORTHO SPLIT 77X108 STRL (DRAPES) ×6
DRAPE SURG ORHT 6 SPLT 77X108 (DRAPES) ×3 IMPLANT
DRAPE U-SHAPE 47X51 STRL (DRAPES) ×3 IMPLANT
DRSG AQUACEL AG ADV 3.5X14 (GAUZE/BANDAGES/DRESSINGS) IMPLANT
DURAPREP 26ML APPLICATOR (WOUND CARE) ×6 IMPLANT
ELECT CAUTERY BLADE 6.4 (BLADE) ×3 IMPLANT
ELECT REM PT RETURN 9FT ADLT (ELECTROSURGICAL) ×3
ELECTRODE REM PT RTRN 9FT ADLT (ELECTROSURGICAL) ×1 IMPLANT
GAUZE SPONGE 4X4 12PLY STRL (GAUZE/BANDAGES/DRESSINGS) ×3 IMPLANT
GAUZE SPONGE 4X4 16PLY XRAY LF (GAUZE/BANDAGES/DRESSINGS) ×3 IMPLANT
GLOVE BIOGEL PI IND STRL 7.0 (GLOVE) ×1 IMPLANT
GLOVE BIOGEL PI IND STRL 8 (GLOVE) ×1 IMPLANT
GLOVE BIOGEL PI INDICATOR 7.0 (GLOVE) ×2
GLOVE BIOGEL PI INDICATOR 8 (GLOVE) ×2
GLOVE ECLIPSE 7.0 STRL STRAW (GLOVE) ×3 IMPLANT
GLOVE ECLIPSE 8.0 STRL XLNG CF (GLOVE) ×3 IMPLANT
GOWN STRL REUS W/ TWL LRG LVL3 (GOWN DISPOSABLE) ×3 IMPLANT
GOWN STRL REUS W/TWL LRG LVL3 (GOWN DISPOSABLE) ×6
HANDPIECE INTERPULSE COAX TIP (DISPOSABLE) ×2
HOOD PEEL AWAY FLYTE STAYCOOL (MISCELLANEOUS) ×9 IMPLANT
IMMOBILIZER KNEE 20 (SOFTGOODS)
IMMOBILIZER KNEE 20 THIGH 36 (SOFTGOODS) IMPLANT
IMMOBILIZER KNEE 22 UNIV (SOFTGOODS) IMPLANT
IMMOBILIZER KNEE 24 THIGH 36 (MISCELLANEOUS) IMPLANT
IMMOBILIZER KNEE 24 UNIV (MISCELLANEOUS)
INSERT TIB PS SZ G RT (Insert) ×1 IMPLANT
INSERT TIBIAL PERSONA 10 RT (Insert) ×1 IMPLANT
INSR TIB PS SZ G RT (Insert) ×3 IMPLANT
KIT BASIN OR (CUSTOM PROCEDURE TRAY) ×3 IMPLANT
KIT TURNOVER KIT B (KITS) ×3 IMPLANT
MANIFOLD NEPTUNE II (INSTRUMENTS) ×3 IMPLANT
NEEDLE 22X1 1/2 (OR ONLY) (NEEDLE) ×6 IMPLANT
NEEDLE SPNL 18GX3.5 QUINCKE PK (NEEDLE) ×3 IMPLANT
NS IRRIG 1000ML POUR BTL (IV SOLUTION) ×6 IMPLANT
PACK TOTAL JOINT (CUSTOM PROCEDURE TRAY) ×3 IMPLANT
PAD ARMBOARD 7.5X6 YLW CONV (MISCELLANEOUS) ×6 IMPLANT
PAD CAST 4YDX4 CTTN HI CHSV (CAST SUPPLIES) ×1 IMPLANT
PADDING CAST ABS 6INX4YD NS (CAST SUPPLIES) ×2
PADDING CAST ABS COTTON 6X4 NS (CAST SUPPLIES) ×1 IMPLANT
PADDING CAST COTTON 4X4 STRL (CAST SUPPLIES) ×2
PADDING CAST COTTON 6X4 STRL (CAST SUPPLIES) ×3 IMPLANT
SET HNDPC FAN SPRY TIP SCT (DISPOSABLE) ×1 IMPLANT
STRIP CLOSURE SKIN 1/2X4 (GAUZE/BANDAGES/DRESSINGS) ×4 IMPLANT
SUCTION FRAZIER HANDLE 10FR (MISCELLANEOUS) ×2
SUCTION TUBE FRAZIER 10FR DISP (MISCELLANEOUS) ×1 IMPLANT
SUT MNCRL AB 3-0 PS2 18 (SUTURE) ×3 IMPLANT
SUT VIC AB 0 CT1 27 (SUTURE) ×6
SUT VIC AB 0 CT1 27XBRD ANBCTR (SUTURE) ×3 IMPLANT
SUT VIC AB 0 CT1 36 (SUTURE) ×3 IMPLANT
SUT VIC AB 1 CT1 27 (SUTURE) ×10
SUT VIC AB 1 CT1 27XBRD ANBCTR (SUTURE) ×5 IMPLANT
SUT VIC AB 2-0 CT1 27 (SUTURE) ×8
SUT VIC AB 2-0 CT1 TAPERPNT 27 (SUTURE) ×4 IMPLANT
SYR 30ML LL (SYRINGE) ×9 IMPLANT
SYR TB 1ML LUER SLIP (SYRINGE) ×3 IMPLANT
TIBIAL INSERT PERSONA 10 RT (Insert) ×3 IMPLANT
TOWEL GREEN STERILE (TOWEL DISPOSABLE) ×6 IMPLANT
TOWEL GREEN STERILE FF (TOWEL DISPOSABLE) ×6 IMPLANT
TRAY CATH 16FR W/PLASTIC CATH (SET/KITS/TRAYS/PACK) IMPLANT
WATER STERILE IRR 1000ML POUR (IV SOLUTION) IMPLANT

## 2020-05-31 NOTE — Evaluation (Signed)
Physical Therapy Evaluation Patient Details Name: Zachary Bates MRN: [id number] DOB: [birth year] Today's Date: 05/31/2020   History of Present Illness  Pt is a 44 y/o male s/p R TKA. Pt with no pertinent PMH.   Clinical Impression  Pt is s/p surgery above with deficits below. Pt with increased pain, so mobility limited. Required min A to sit at EOB. Anticipate pt will progress well once pain controlled. Educated about knee precautions. Will continue to follow acutely.     Follow Up Recommendations Follow surgeon's recommendation for DC plan and follow-up therapies    Equipment Recommendations  None recommended by PT    Recommendations for Other Services       Precautions / Restrictions Precautions Precautions: Knee Precaution Booklet Issued: No Precaution Comments: Verbally reviewed knee precautions with pt.  Restrictions Weight Bearing Restrictions: Yes RLE Weight Bearing: Weight bearing as tolerated      Mobility  Bed Mobility Overal bed mobility: Needs Assistance Bed Mobility: Supine to Sit;Sit to Supine     Supine to sit: Min assist Sit to supine: Min assist   General bed mobility comments: Min A for RLE assist to come to sitting. Pt with increased pain, so further mobility deferred. Able to laterally scoot towards [facility] with supervision.     Transfers                    Ambulation/Gait                Stairs            Wheelchair Mobility    Modified Rankin (Stroke Patients Only)       Balance Overall balance assessment: Needs assistance Sitting-balance support: No upper extremity supported;Feet supported Sitting balance-Leahy Scale: Fair                                       Pertinent Vitals/Pain Pain Assessment: Faces Faces Pain Scale: Hurts worst Pain Location: R knee  Pain Descriptors / Indicators: Aching;Operative site guarding;Grimacing;Guarding Pain Intervention(s): Limited activity within patient's  tolerance;Monitored during session;Repositioned;Ice applied    Home Living Family/patient expects to be discharged to:: Private residence Living Arrangements: Spouse/significant other;Children Available Help at Discharge: Family;Available 24 hours/day Type of Home: House Home Access: Stairs to enter Entrance Stairs-Rails: Doctor, general practice of Steps: 2 Home Layout: One level Home Equipment: Walker - 2 wheels;Other (comment) (CPM)      Prior Function Level of Independence: Independent               Hand Dominance        Extremity/Trunk Assessment   Upper Extremity Assessment Upper Extremity Assessment: Overall WFL for tasks assessed    Lower Extremity Assessment Lower Extremity Assessment: RLE deficits/detail RLE Deficits / Details: Deficits consistent with post op pain and weakness. Pt reports RLE still feels numb.     Cervical / Trunk Assessment Cervical / Trunk Assessment: Normal  Communication   Communication: No difficulties  Cognition Arousal/Alertness: Awake/alert Behavior During Therapy: WFL for tasks assessed/performed Overall Cognitive Status: Within Functional Limits for tasks assessed                                        General Comments General comments (skin integrity, edema, etc.): Pt's wife present during session  Exercises     Assessment/Plan    PT Assessment Patient needs continued PT services  PT Problem List Decreased strength;Decreased range of motion;Decreased activity tolerance;Decreased balance;Decreased mobility;Decreased knowledge of use of DME;Decreased knowledge of precautions;Pain       PT Treatment Interventions DME instruction;Gait training;Stair training;Functional mobility training;Therapeutic activities;Therapeutic exercise;Balance training;Patient/family education    PT Goals (Current goals can be found in the Care Plan section)  Acute Rehab PT Goals Patient Stated Goal: to decrease  pain  PT Goal Formulation: With patient Time For Goal Achievement: 06/14/20 Potential to Achieve Goals: Good    Frequency 7X/week   Barriers to discharge        Co-evaluation               AM-PAC PT "6 Clicks" Mobility  Outcome Measure Help needed turning from your back to your side while in a flat bed without using bedrails?: A Little Help needed moving from lying on your back to sitting on the side of a flat bed without using bedrails?: A Little Help needed moving to and from a bed to a chair (including a wheelchair)?: A Little Help needed standing up from a chair using your arms (e.g., wheelchair or bedside chair)?: A Little Help needed to walk in hospital room?: A Lot Help needed climbing 3-5 steps with a railing? : Total 6 Click Score: 15    End of Session   Activity Tolerance: Patient limited by pain Patient left: in bed;with call bell/phone within reach;with SCD's reapplied Nurse Communication: Mobility status PT Visit Diagnosis: Other abnormalities of gait and mobility (R26.89);Pain Pain - Right/Left: Right Pain - part of body: Knee    Time: 2878-6767 PT Time Calculation (min) (ACUTE ONLY): 18 min   Charges:   PT Evaluation $PT Eval Low Complexity: 1 Low          Cindee Salt, DPT  Acute Rehabilitation Services  Pager: [phone] Office: [phone]   Lehman Prom 05/31/2020, 3:48 PM

## 2020-05-31 NOTE — Anesthesia Postprocedure Evaluation (Signed)
Anesthesia Post Note  Patient: Zi [facility]  Procedure(s) Performed: RIGHT TOTAL KNEE ARTHROPLASTY (Right Knee)     Patient location during evaluation: PACU Anesthesia Type: Spinal Level of consciousness: awake and alert, patient cooperative and oriented Pain management: pain level controlled Vital Signs Assessment: post-procedure vital signs reviewed and stable Respiratory status: spontaneous breathing, nonlabored ventilation and respiratory function stable Cardiovascular status: stable and blood pressure returned to baseline Postop Assessment: spinal receding, patient able to bend at knees and no apparent nausea or vomiting Anesthetic complications: no   No complications documented.  Last Vitals:  Vitals:   05/31/20 1225 05/31/20 1240  BP: 110/70 107/77  Pulse: (!) 55 65  Resp: 12 19  Temp:    SpO2: 98% 99%    Last Pain:  Vitals:   05/31/20 1225  TempSrc:   PainSc: 0-No pain                 Sotero Brinkmeyer,E. Nanea Jared

## 2020-05-31 NOTE — Progress Notes (Signed)
Paged Dr. August Saucer. Pt. Has bug bites still noticeable on lower extremities. Red, some raised, no  drainage noted. Pt. Reported this happened in August, and Dr. August Saucer has seen these. Pt. States he followed up with a dermatologist.

## 2020-05-31 NOTE — Brief Op Note (Signed)
   05/31/2020  11:45 AM  PATIENT:  Zachary Bates  44 y.o. male  PRE-OPERATIVE DIAGNOSIS:  right knee osteoarthritis  POST-OPERATIVE DIAGNOSIS:  right knee osteoarthritis  PROCEDURE:  Procedure(s): RIGHT TOTAL KNEE ARTHROPLASTY  SURGEON:  Surgeon(s): Cammy Copa, MD  ASSISTANT: Karenann Cai, PA  ANESTHESIA:   spinal  EBL: 67 ml    Total I/O In: -  Out: 200 [Blood:200]  BLOOD ADMINISTERED: none  DRAINS: none   LOCAL MEDICATIONS USED: Marcaine morphine clonidine Exparel vancomycin powder  SPECIMEN:  No Specimen  COUNTS:  YES  TOURNIQUET:   Total Tourniquet Time Documented: Thigh (Right) - 120 minutes Total: Thigh (Right) - 120 minutes   DICTATION: .Other Dictation: Dictation Number [id number]  PLAN OF CARE: Admit for overnight observation  PATIENT DISPOSITION:  PACU - hemodynamically stable

## 2020-05-31 NOTE — Progress Notes (Signed)
Orthopedic Tech Progress Note Patient Details:  Zachary Bates [facility] [birth year] [id number]  CPM Right Knee CPM Right Knee: On Right Knee Flexion (Degrees): 10 Right Knee Extension (Degrees): 40  Post Interventions Patient Tolerated: Well Instructions Provided: Care of device, Adjustment of device  Zachary Bates 05/31/2020, 1:08 PM

## 2020-05-31 NOTE — H&P (Signed)
TOTAL KNEE ADMISSION H&P  Patient is being admitted for right total knee arthroplasty.  Subjective:  Chief Complaint:right knee pain.  HPI: Zachary Bates [facility], 44 y.o. male, has a history of pain and functional disability in the right knee due to arthritis and has failed non-surgical conservative treatments for greater than 12 weeks to includeNSAID's and/or analgesics, corticosteriod injections, viscosupplementation injections, flexibility and strengthening excercises and activity modification.  Onset of symptoms was gradual, starting >10 years ago with gradually worsening course since that time. The patient noted no past surgery on the right knee(s).  Patient currently rates pain in the right knee(s) at 9 out of 10 with activity. Patient has night pain, worsening of pain with activity and weight bearing, pain that interferes with activities of daily living, pain with passive range of motion, crepitus and joint swelling.  Patient has evidence of subchondral sclerosis and joint space narrowing by imaging studies. This patient has had Extensive work-up and has end-stage knee arthritis which is becoming intolerable.. There is no active infection.  There are no problems to display for this patient.  Past Medical History:  Diagnosis Date  . Abscess 2009   Left 5th phalange     Past Surgical History:  Procedure Laterality Date  . CYST EXCISION Left    Left 5th phalange  . LAPAROSCOPIC GASTRIC BYPASS  2008    Current Facility-Administered Medications  Medication Dose Route Frequency Provider Last Rate Last Admin  . acetaminophen (TYLENOL) tablet 1,000 mg  1,000 mg Oral Once Jairo Ben, MD      . bupivacaine liposome (EXPAREL) 1.3 % injection 266 mg  20 mL Infiltration To OR August Saucer Corrie Mckusick, MD      . ceFAZolin (ANCEF) IVPB 2g/100 mL premix  2 g Intravenous On Call to OR Magnant, Charles L, PA-C      . povidone-iodine (BETADINE) 7.5 % scrub   Topical Once Magnant, Charles L, PA-C      .  povidone-iodine 10 % swab 2 application  2 application Topical Once Magnant, Charles L, PA-C      . povidone-iodine 10 % swab 2 application  2 application Topical Once Magnant, Charles L, PA-C      . tranexamic acid (CYKLOKAPRON) 2,000 mg in sodium chloride 0.9 % 50 mL Topical Application  2,000 mg Topical To OR Cammy Copa, MD      . tranexamic acid (CYKLOKAPRON) IVPB 1,000 mg  1,000 mg Intravenous To OR Magnant, Charles L, PA-C       Allergies  Allergen Reactions  . Alpha-Gal     pork    Social History   Tobacco Use  . Smoking status: Never Smoker  . Smokeless tobacco: Never Used  Substance Use Topics  . Alcohol use: Not Currently    History reviewed. No pertinent family history.   Review of Systems  Musculoskeletal: Positive for arthralgias.  All other systems reviewed and are negative.   Objective:  Physical Exam Vitals reviewed.  HENT:     Head: Normocephalic.     Nose: Nose normal.     Mouth/Throat:     Mouth: Mucous membranes are moist.  Eyes:     Pupils: Pupils are equal, round, and reactive to light.  Cardiovascular:     Rate and Rhythm: Normal rate.     Pulses: Normal pulses.  Pulmonary:     Effort: Pulmonary effort is normal.  Abdominal:     General: Abdomen is flat.  Musculoskeletal:     Cervical back:  Normal range of motion.  Skin:    General: Skin is warm.     Capillary Refill: Capillary refill takes less than 2 seconds.  Neurological:     General: No focal deficit present.     Mental Status: He is alert.  Psychiatric:        Mood and Affect: Mood normal.   Examination of the right leg demonstrates no planovalgus foot deformity.  Pedal pulses palpable.  Ankle dorsiflexion intact.  Valgus alignment is present slightly less than 20 degrees.  No hyperextension of the knee.  The valgus deformity is only partially correctable.  Patient does have flexion past 90 degrees.  No groin pain with internal or external Tatian of the leg.  Patient has what  appears to be bites on the leg which have been present since the summer.  They are improved compared to last clinic visit.  No open wounds or draining sinuses.  Vital signs in last 24 hours: Temp:  [98.2 F (36.8 C)] 98.2 F (36.8 C) (12/07 0620) Pulse Rate:  [59] 59 (12/07 0638) Resp:  [18] 18 (12/07 5462) BP: (136)/(88) 136/88 (12/07 0638) SpO2:  [100 %] 100 % (12/07 7035) Weight:  [104.8 kg] 104.8 kg (12/07 0093)  Labs:   Estimated body mass index is 32.22 kg/m as calculated from the following:   Height as of this encounter: 5\' 11"  (1.803 m).   Weight as of this encounter: 104.8 kg.   Imaging Review Plain radiographs demonstrate severe degenerative joint disease of the right knee(s). The overall alignment issignificant valgus. The bone quality appears to be good for age and reported activity level.      Assessment/Plan:  End stage arthritis, right knee   The patient history, physical examination, clinical judgment of the provider and imaging studies are consistent with end stage degenerative joint disease of the right knee(s) and total knee arthroplasty is deemed medically necessary. The treatment options including medical management, injection therapy arthroscopy and arthroplasty were discussed at length. The risks and benefits of total knee arthroplasty were presented and reviewed. The risks due to aseptic loosening, infection, stiffness, patella tracking problems, thromboembolic complications and other imponderables were discussed. The patient acknowledged the explanation, agreed to proceed with the plan and consent was signed. Patient is being admitted for inpatient treatment for surgery, pain control, PT, OT, prophylactic antibiotics, VTE prophylaxis, progressive ambulation and ADL's and discharge planning. The patient is planning to be discharged home with home health services.  Operative plan is for progressive soft tissue release to achieve deformity correction.  May or may  not require PCL release.  We will have various degrees of constraint available.  Risk and benefits are discussed with the patient including not limited to infection nerve vessel damage as well as potential for nerve damage particularly with correction of the valgus knee deformity.  Patient understands and wishes to proceed.     Patient's anticipated LOS is less than 2 midnights, meeting these requirements: - Younger than 70 - Lives within 1 hour of care - Has a competent adult at home to recover with post-op recover - NO history of  - Chronic pain requiring opiods  - Diabetes  - Coronary Artery Disease  - Heart failure  - Heart attack  - Stroke  - DVT/VTE  - Cardiac arrhythmia  - Respiratory Failure/COPD  - Renal failure  - Anemia  - Advanced Liver disease

## 2020-05-31 NOTE — Plan of Care (Signed)

## 2020-05-31 NOTE — Anesthesia Procedure Notes (Signed)
Spinal  Patient location during procedure: OR End time: 05/31/2020 7:49 AM Staffing Performed: anesthesiologist  Anesthesiologist: Jairo Ben, MD Preanesthetic Checklist Completed: patient identified, IV checked, site marked, risks and benefits discussed, surgical consent, monitors and equipment checked, pre-op evaluation and timeout performed Spinal Block Patient position: sitting Prep: ChloraPrep and site prepped and draped Patient monitoring: blood pressure, continuous pulse ox, cardiac monitor and heart rate Approach: midline Location: L3-4 Injection technique: single-shot Needle Needle type: Pencan and Introducer  Needle gauge: 24 G Needle length: 9 cm Additional Notes Pt identified in Operating room.  Monitors applied. Working IV access confirmed. Sterile prep, drape lumbar spine.  1% lido local L 3,4.  #24ga Pencan into clear CSF L 3,4.  15mg  0.75% Bupivacaine with dextrose injected with asp CSF beginning and end of injection.  Patient asymptomatic, VSS, no heme aspirated, tolerated well.  , MD

## 2020-05-31 NOTE — Transfer of Care (Signed)
Immediate Anesthesia Transfer of Care Note  Patient: Zachary Bates [facility]  Procedure(s) Performed: RIGHT TOTAL KNEE ARTHROPLASTY (Right Knee)  Patient Location: PACU  Anesthesia Type:MAC combined with regional for post-op pain  Level of Consciousness: awake, alert , oriented and patient cooperative  Airway & Oxygen Therapy: Patient Spontanous Breathing and Patient connected to nasal cannula oxygen  Post-op Assessment: Report given to RN and Post -op Vital signs reviewed and stable  Post vital signs: Reviewed and stable  Last Vitals:  Vitals Value Taken Time  BP 127/78 05/31/20 1149  Temp    Pulse 82 05/31/20 1154  Resp 15 05/31/20 1154  SpO2 96 % 05/31/20 1154  Vitals shown include unvalidated device data.  Last Pain:  Vitals:   05/31/20 0620  TempSrc: Oral  PainSc:          Complications: No complications documented.

## 2020-05-31 NOTE — Anesthesia Procedure Notes (Signed)
Anesthesia Regional Block: Adductor canal block   Pre-Anesthetic Checklist: ,, timeout performed, Correct Patient, Correct Site, Correct Laterality, Correct Procedure, Correct Position, site marked, Risks and benefits discussed,  Surgical consent,  Pre-op evaluation,  At surgeon's request and post-op pain management  Laterality: Right and Lower  Prep: chloraprep       Needles:  Injection technique: Single-shot  Needle Type: Echogenic Needle     Needle Length: 9cm  Needle Gauge: 21     Additional Needles:   Procedures:,,,, ultrasound used (permanent image in chart),,,,  Narrative:  Start time: 05/31/2020 7:10 AM End time: 05/31/2020 7:16 AM Injection made incrementally with aspirations every 5 mL.  Performed by: Personally  Anesthesiologist: Jairo Ben, MD  Additional Notes: Pt identified in Holding room.  Monitors applied. Working IV access confirmed. Sterile prep R thigh.  #21ga ECHOgenic Arrow block needle into adductor canal with US guidance.  20cc 0.75% Ropivacaine injected incrementally after negative test dose.  Patient asymptomatic, VSS, no heme aspirated, tolerated well.  Sandford Craze, MD

## 2020-06-01 ENCOUNTER — Encounter (HOSPITAL_COMMUNITY): Payer: Self-pay | Admitting: Orthopedic Surgery

## 2020-06-01 DIAGNOSIS — M1711 Unilateral primary osteoarthritis, right knee: Secondary | ICD-10-CM | POA: Diagnosis not present

## 2020-06-01 MED ORDER — GABAPENTIN 300 MG PO CAPS
300.0000 mg | ORAL_CAPSULE | Freq: Three times a day (TID) | ORAL | Status: DC
  Administered 2020-06-01 – 2020-06-02 (×5): 300 mg via ORAL
  Filled 2020-06-01 (×5): qty 1

## 2020-06-01 NOTE — Progress Notes (Signed)
  Subjective: Zachary Bates is a 44 y.o. male s/p right TKA.  They are POD 1.  Pt's pain is controlled but moderate.  Pt denies numbness/tingling/weakness.  Patient has not ambulated yet but he was able to sit up on the side of his bed yesterday without dizziness or lightheadedness.  Therapy has not worked with him yet today.   Objective: Vital signs in last 24 hours: Temp:  [97.6 F (36.4 C)-99.2 F (37.3 C)] 99.2 F (37.3 C) (12/08 0834) Pulse Rate:  [55-77] 65 (12/08 0834) Resp:  [6-19] 18 (12/08 0834) BP: (106-151)/(70-91) 151/91 (12/08 0834) SpO2:  [96 %-100 %] 100 % (12/08 0834)  Intake/Output from previous day: 12/07 0701 - 12/08 0700 In: 1135.6 [P.O.:240; I.V.:895.6] Out: 1900 [Urine:1700; Blood:200] Intake/Output this shift: No intake/output data recorded.  Exam:  No gross blood or drainage overlying the dressing 2+ DP pulse Sensation intact distally in the right foot Able to dorsiflex and plantarflex the right foot   Labs: No results for input(s): HGB in the last 72 hours. No results for input(s): WBC, RBC, HCT, PLT in the last 72 hours. No results for input(s): NA, K, CL, CO2, BUN, CREATININE, GLUCOSE, CALCIUM in the last 72 hours. No results for input(s): LABPT, INR in the last 72 hours.  Assessment/Plan: Pt is POD 1 s/p right TKA.    -Plan to discharge to home in coming days pending patient's pain and PT eval.  Likely will take longer than typical due to patient's degree of valgus deformity preoperatively.  -WBAT with a walker   Maliik Karner L Zelma Mazariego 06/01/2020, 8:59 AM

## 2020-06-01 NOTE — Op Note (Signed)
NAME: Zachary Bates, Zachary W. 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  PROCEDURE:  Right total knee replacement using press-fit components Persona posterior cruciate retaining trabecular metal size 10 femur with fixed bearing trabecular metal 2 peg press fit size G tibia with 35 mm trabecular metal primary patella press fit  and medial congruent 10 mm polyethylene insert.  SURGEON:  Cammy Copa, MD  ASSISTANT:  Karenann Cai, PA.  INDICATIONS:  Zachary Bates is a 44 year old patient with approximately 20 degrees of valgus alignment and end-stage right knee arthritis, who presents for operative management after explanation of risks and benefits.  PROCEDURE IN DETAIL:  The patient was brought to the operating room where spinal anesthetic was induced.  Preoperative antibiotics administered.  Timeout was called.  Right leg prescrubbed with alcohol and Betadine, allowed to air dry, prepped with  DuraPrep solution and draped in sterile manner.  The patient had about 2-3 degrees of hyperextension compatible with the left knee.  He had flexion to about 115 degrees.  Valgus deformity of 20 degrees was only partially correctable to about 5-10  degrees.  Right leg was prescrubbed with alcohol and Betadine, prepped with DuraPrep solution and draped in a sterile manner.  Ioban used to cover the operative field.  Timeout was called.  Left leg elevated, exsanguinated with the Esmarch wrap.   Tourniquet was inflated.  Medial parapatellar approach was made.  Skin and subcutaneous tissue were sharply divided.  Fat pad was partially excised.  Soft tissue removed from the anterior distal femur.  Lateral  patellofemoral ligament was released.   Minimal soft tissue dissection was performed on the medial aspect of the proximal tibia.  At this time, using electrocautery, the anterolateral and posterolateral capsule was released from the tibia.  This was performed both with electrocautery as well  as with a smooth Cobb elevator.  The iliotibial band was released in subperiosteal fashion as it was very tight in extension.  In a similar manner, the posterolateral corner structures were released off of the posterolateral tibia.  The popliteus and LCL  were maintained.  Following this, soft tissue release, the deformity became correctable to near normal alignment.  Intramedullary alignment was then used to cut the tibia, which was ended up being about a 10 mm resection after 1 revision cut off of the  least affected medial tibial plateau.  This was done with the posterior neurovascular structures and the medial collateral and lateral structures protected.  Next, the femur was then cut, initially 10 mm off the least affected medial tibial plateau.   This was later revised to 2 more millimeters in order to achieve a good bony surface on the lateral femoral condyle, which was sclerotic.  Following these resections and further release of the soft tissue off the proximal lateral tibia the patient  achieved good balance in extension with a 10 mm spacer.  Slightly more laxity to valgus stress, but overall balance of the knee confirmed both visually as well as with the space as well as with the static and dynamic spacer from the set.  Next, the  anterior, posterior chamfer cuts were made.  Two methods for determining external rotation were performed.  First was 3 degrees of external rotation, which was in line with the  epicondylar access, but did not give a symmetric flexion gap.  Using the  dynamic spacing guide the dynamic flexion gap symmetry method was chosen.  This did later require lateral release, which was not too  surprising.  Following the anterior, posterior and chamfer cuts the spacer was placed and the patient did achieve full  extension.  The patient had a solid stability laterally and about 3 mm of laxity medially in extension.  Flexion gap also symmetric.  Trial components placed.  With the trial components in position, the patient achieved full extension and very good  alignment.  It should be noted that the femur was cut in 3 degrees of valgus to prevent under correction.  Next, the patient was trialed with a 10 mm spacer and found to have slight lateral patellar tracking, which was not surprising, but good stability  and good alignment.  Trial components were removed.  The patella was cut down from 28 to approximately 16 mm.  Trial patellar button was placed medialized on the patella.  This gave the same stability parameters.  The femur was slightly lateralized to  improve patellar tracking.  Next, the tibia was keel punched.  Trial components were removed.  Thorough irrigation was performed.  Tourniquet released.  Bleeding points encountered controlled using electrocautery.  Solution of Exparel, saline and  Marcaine injected into the soft tissue and capsules.  Tranexamic acid sponge allowed to sit for 3 minutes along with the IrriSept-soaked sponge into the incision into the arthrotomy.  The IrriSept was utilized at all times during the case after the  incision as well as after the arthrotomy.  The true components were then tapped into position.  The patient was within about 3 degrees of full extension.  This was with the tourniquet down, which did change some of the dynamic.  Very stable knee was  achieved, which was well aligned.  After lateral release, the patella tracked well using no thumbs technique.  Thorough irrigation again performed.  Bleeding points encountered controlled using electrocautery.  The arthrotomy was then closed over a  bolster.  Patella was also placed and had very good press fit  throughout.  It should be noted that some drilling of the sclerotic bony surface was performed in order to facilitate bony ingrowth.  Next, the patient had very good range of motion, good  stability and good patellar tracking.  The knee was then closed over a bolster using #1 Vicryl suture followed by interrupted inverted 0 Vicryl suture, 2-0 Vicryl suture and 3-0 Monocryl.  Solution of Marcaine, morphine, clonidine injected into the  arthrotomy site after closure and just before closure.  Vancomycin powder was placed both within the knee joint as well as above the arthrotomy closure.  Steri-Strips and Aquacel dressing applied.  The patient had an Ace wrap applied, but we were going  to allow him to flex his knee to avoid any extension pressure on the peroneal nerve.  The patient tolerated the procedure well without immediate complications, transferred to the recovery room in stable condition.  Luke's assistance was required for  opening and closing, retraction.  His assistance was a medical necessity.  HN/NUANCE  D:05/31/2020 T:06/01/2020 JOB:[id number]

## 2020-06-01 NOTE — Progress Notes (Signed)
Physical Therapy Treatment Patient Details Name: Zachary Bates MRN: [id number] DOB: 08/06/1975 Today's Date: 06/01/2020    History of Present Illness (P) Pt is a 44 y/o male s/p R TKA. Pt with no pertinent PMH.     PT Comments    Continuing work on functional mobility and activity tolerance;  Session focused on progressive ambulation,  With notable improvements in gait distance and R knee stability in stance; Plan for steps next session, and will need reinforcement of therex; On track for dc home tomorrow, especially if he keeps up this rate of progress  Follow Up Recommendations  (P) Follow surgeon's recommendation for DC plan and follow-up therapies     Equipment Recommendations  (P) None recommended by PT    Recommendations for Other Services (P) OT consult     Precautions / Restrictions Precautions Precautions: (P) Knee Precaution Booklet Issued: (P) Yes (comment) Precaution Comments: (P) Verbally reviewed knee precautions with pt.  Restrictions RLE Weight Bearing: Weight bearing as tolerated  Pt educated to not allow any pillow or bolster under knee for healing with optimal range of motion.    Mobility  Bed Mobility                  Transfers Overall transfer level: (P) Needs assistance Equipment used: (P) Rolling walker (2 wheeled) Transfers: (P) Sit to/from Stand Sit to Stand: (P) Supervision         General transfer comment: (P) Good hand placement and push up; no need for phsyical assist  Ambulation/Gait Ambulation/Gait assistance: (P) Min assist;+2 safety/equipment (wife pushed chair (likely won't need chair tomorrow)) Gait Distance (Feet): (P) 120 Feet (50 +70) Assistive device: (P) Rolling walker (2 wheeled) Gait Pattern/deviations: (P) Step-through pattern (emerging)     General Gait Details: (P) Still very painful in R stance, but able to significantly increase distance; R knee is tending to buckle slightly, but he is able to support self on  RW   Stairs             Wheelchair Mobility    Modified Rankin (Stroke Patients Only)       Balance                                            Cognition Arousal/Alertness: (P) Awake/alert Behavior During Therapy: (P) WFL for tasks assessed/performed Overall Cognitive Status: (P) Within Functional Limits for tasks assessed                                        Exercises      General Comments General comments (skin integrity, edema, etc.): (P) Wife present during session, supportive and helpful      Pertinent Vitals/Pain Pain Assessment: (P) 0-10 Pain Score: (P) 8  Pain Location: (P) R knee  Pain Descriptors / Indicators: (P) Aching;Operative site guarding;Grimacing;Guarding Pain Intervention(s): (P) Premedicated before session;Monitored during session    Home Living                      Prior Function            PT Goals (current goals can now be found in the care plan section) Acute Rehab PT Goals Patient Stated Goal: (P) to decrease pain  PT Goal Formulation: (P)  With patient Time For Goal Achievement: (P) 06/14/20 Potential to Achieve Goals: (P) Good Progress towards PT goals: (P) Progressing toward goals    Frequency    (P) 7X/week      PT Plan (P) Current plan remains appropriate    Co-evaluation              AM-PAC PT "6 Clicks" Mobility   Outcome Measure  Help needed turning from your back to your side while in a flat bed without using bedrails?: (P) A Little Help needed moving from lying on your back to sitting on the side of a flat bed without using bedrails?: (P) A Little Help needed moving to and from a bed to a chair (including a wheelchair)?: (P) A Little Help needed standing up from a chair using your arms (e.g., wheelchair or bedside chair)?: (P) A Little Help needed to walk in hospital room?: (P) A Little Help needed climbing 3-5 steps with a railing? : (P) Total 6 Click  Score: (P) 16    End of Session Equipment Utilized During Treatment: (P) Gait belt Activity Tolerance: (P) Patient tolerated treatment well Patient left: (P) in chair;with call bell/phone within reach;with family/visitor present (preparing to eat lunch) Nurse Communication: (P) Mobility status PT Visit Diagnosis: (P) Other abnormalities of gait and mobility (R26.89);Pain Pain - Right/Left: (P) Right Pain - part of body: (P) Knee     Time: 5621-3086 PT Time Calculation (min) (ACUTE ONLY): 31 min  Charges:  $Gait Training: 23-37 mins                     Van Clines, [place]  Acute Rehabilitation Services Pager [phone] Office [phone]    [name] 06/01/2020, 6:03 PM

## 2020-06-01 NOTE — Progress Notes (Signed)
Physical Therapy Treatment Patient Details Name: Zachary Bates MRN: [id number] DOB: Jul 06, 1975 Today's Date: 06/01/2020    History of Present Illness Pt is a 44 y/o male s/p R TKA. Pt with no pertinent PMH.     PT Comments    Continuing work on functional mobility and activity tolerance;  Session focused on initial transfer OOB and initial gait training; Very painful, but took a few steps; slight buckling noted in R stance; Gave pt and wife extensive education on positioning and importance of gaining and keeping full knee extension postop; Per Ortho, Zachary Bates's TKA was extensive, and he had quite a valgus deformity preop   Follow Up Recommendations  Follow surgeon's recommendation for DC plan and follow-up therapies     Equipment Recommendations  None recommended by PT    Recommendations for Other Services OT consult (will order per protocol)     Precautions / Restrictions Precautions Precautions: Knee Precaution Comments: Verbally reviewed knee precautions with pt.  Restrictions RLE Weight Bearing: Weight bearing as tolerated    Mobility  Bed Mobility Overal bed mobility: Needs Assistance Bed Mobility: Supine to Sit     Supine to sit: Min assist     General bed mobility comments: Min A for RLE assist to come to sitting.   Transfers Overall transfer level: Needs assistance Equipment used: Rolling walker (2 wheeled) Transfers: Sit to/from Stand Sit to Stand: Min assist         General transfer comment: Min assist to steady; cues for hand placement  Ambulation/Gait Ambulation/Gait assistance: Min assist;+2 safety/equipment (wife pushed chair) Gait Distance (Feet): 6 Feet Assistive device: Rolling walker (2 wheeled) Gait Pattern/deviations: Step-to pattern     General Gait Details: Still very painful in R stance, but able to take a few steps; R knee is tending to buckle slightly, but he is able to support self on RW   Stairs             Wheelchair  Mobility    Modified Rankin (Stroke Patients Only)       Balance                                            Cognition Arousal/Alertness: Awake/alert Behavior During Therapy: WFL for tasks assessed/performed Overall Cognitive Status: Within Functional Limits for tasks assessed                                        Exercises      General Comments General comments (skin integrity, edema, etc.): Wife present during session, supportive and helpful      Pertinent Vitals/Pain Pain Assessment: 0-10 Pain Score: 9  Pain Location: R knee  Pain Descriptors / Indicators: Aching;Operative site guarding;Grimacing;Guarding Pain Intervention(s): Monitored during session;Ice applied    Home Living                      Prior Function            PT Goals (current goals can now be found in the care plan section) Acute Rehab PT Goals Patient Stated Goal: to decrease pain  PT Goal Formulation: With patient Time For Goal Achievement: 06/14/20 Potential to Achieve Goals: Good Progress towards PT goals: Progressing toward goals    Frequency  7X/week      PT Plan Current plan remains appropriate    Co-evaluation              AM-PAC PT "6 Clicks" Mobility   Outcome Measure  Help needed turning from your back to your side while in a flat bed without using bedrails?: A Little Help needed moving from lying on your back to sitting on the side of a flat bed without using bedrails?: A Little Help needed moving to and from a bed to a chair (including a wheelchair)?: A Little Help needed standing up from a chair using your arms (e.g., wheelchair or bedside chair)?: A Little Help needed to walk in hospital room?: A Little Help needed climbing 3-5 steps with a railing? : Total 6 Click Score: 16    End of Session Equipment Utilized During Treatment: Gait belt Activity Tolerance: Patient tolerated treatment well Patient left: in  chair;with call bell/phone within reach;with family/visitor present Nurse Communication: Mobility status PT Visit Diagnosis: Other abnormalities of gait and mobility (R26.89);Pain Pain - Right/Left: Right Pain - part of body: Knee     Time: 9892-1194 PT Time Calculation (min) (ACUTE ONLY): 47 min  Charges:  $Gait Training: 8-22 mins $Therapeutic Activity: 23-37 mins                     Van Clines, PT  Acute Rehabilitation Services Pager [phone] Office [phone]    [name] 06/01/2020, 2:46 PM

## 2020-06-01 NOTE — Plan of Care (Signed)

## 2020-06-02 ENCOUNTER — Other Ambulatory Visit: Payer: Self-pay

## 2020-06-02 ENCOUNTER — Telehealth: Payer: Self-pay | Admitting: Orthopedic Surgery

## 2020-06-02 DIAGNOSIS — M1711 Unilateral primary osteoarthritis, right knee: Secondary | ICD-10-CM | POA: Diagnosis not present

## 2020-06-02 DIAGNOSIS — Z96651 Presence of right artificial knee joint: Secondary | ICD-10-CM

## 2020-06-02 MED ORDER — METHOCARBAMOL 500 MG PO TABS: 500.0000 mg | ORAL_TABLET | Freq: Three times a day (TID) | ORAL | 0 refills | Status: DC | PRN

## 2020-06-02 MED ORDER — CELECOXIB 200 MG PO CAPS
200.0000 mg | ORAL_CAPSULE | Freq: Two times a day (BID) | ORAL | 0 refills | Status: DC
Start: 2020-06-02 — End: 2020-07-01

## 2020-06-02 MED ORDER — ASPIRIN 81 MG PO CHEW
81.0000 mg | CHEWABLE_TABLET | Freq: Every day | ORAL | 0 refills | Status: DC
Start: 2020-06-02 — End: 2020-06-27

## 2020-06-02 MED ORDER — OXYCODONE HCL 5 MG PO TABS: 5.0000 mg | ORAL_TABLET | ORAL | 0 refills | Status: DC | PRN

## 2020-06-02 NOTE — Progress Notes (Signed)
Physical Therapy Treatment Patient Details Name: Zachary Bates MRN: [id number] DOB: [birth year] Today's Date: 06/02/2020    History of Present Illness Pt is a 44 y/o male s/p R TKA. Pt with no pertinent PMH.     PT Comments    The pt is making good progress with mobility and PT goals at this time. Session with focus on progression of independence with transfers/gait as well as final education and HEP for ROM exercises. The pt was able to complete multiple AROM exercises with RLE, with good activation of R quadriceps with knee extension. The pt was then able to complete sit-stand transfers without any physical assist for powering up or stabilizing, and was able to progress ambulation distance with improved fluidity and stance time on RLE. The pt will continue to benefit from skilled PT to recover full AROM of RLE, improve strength, stability, and endurance, as well reduce dependence on AD for mobility.    Follow Up Recommendations  Follow surgeon's recommendation for DC plan and follow-up therapies (OPPT)     Equipment Recommendations  None recommended by PT    Recommendations for Other Services       Precautions / Restrictions Precautions Precautions: Knee Precaution Booklet Issued: Yes (comment) Precaution Comments: Verbally reviewed knee precautions with pt.  Restrictions Weight Bearing Restrictions: Yes RLE Weight Bearing: Weight bearing as tolerated    Mobility  Bed Mobility Overal bed mobility: Modified Independent             General bed mobility comments: pt up in recliner upon arrival of PT  Transfers Overall transfer level: Needs assistance Equipment used: Rolling walker (2 wheeled) Transfers: Sit to/from Stand Sit to Stand: Modified independent (Device/Increase time)         General transfer comment: pt able to demo multiple sit-stand with no physical assist, discussed standing from recliner at home that rocks  Ambulation/Gait Ambulation/Gait  assistance: Min guard Gait Distance (Feet): 150 Feet Assistive device: Rolling walker (2 wheeled) Gait Pattern/deviations: Step-through pattern;Step-to pattern;Decreased stance time - right;Decreased dorsiflexion - right;Antalgic Gait velocity: 0.4 m/s Gait velocity interpretation: <1.31 ft/sec, indicative of household ambulator General Gait Details: painful in RLE, but able to manage with significant UE support   Stairs Stairs: Yes Stairs assistance: Min guard Stair Management: One rail Right;Sideways;Step to pattern Number of Stairs: 2 General stair comments: pt climbed with sideways step-to. wife present and educated on technique. all voiced understanding and agreement.      Balance Overall balance assessment: Needs assistance Sitting-balance support: No upper extremity supported;Feet supported Sitting balance-Leahy Scale: Good     Standing balance support: During functional activity;Bilateral upper extremity supported Standing balance-Leahy Scale: Fair Standing balance comment: fair static standing unsupported, need for B UE support for balance and due to LE pain                            Cognition Arousal/Alertness: Awake/alert Behavior During Therapy: WFL for tasks assessed/performed Overall Cognitive Status: Within Functional Limits for tasks assessed                                 General Comments: agreeable to all education      Exercises Total Joint Exercises Short Arc Quad: AROM;Right;5 reps;Seated Heel Slides: AROM;Right;10 reps;Seated Knee Flexion: AROM;Right;10 reps;Seated    General Comments General comments (skin integrity, edema, etc.): per CM, pt set up with OPPT  to start tomorrow, pt educated on continued use of CPM with addition of HEP for active ROM exercises. Pt has no further questions regarding HEP, transfers, and follow-up therapies.      Pertinent Vitals/Pain Pain Assessment: Faces Faces Pain Scale: Hurts little  more Pain Location: R knee  Pain Descriptors / Indicators: Aching;Operative site guarding;Grimacing;Guarding Pain Intervention(s): Limited activity within patient's tolerance;Monitored during session;Repositioned           PT Goals (current goals can now be found in the care plan section) Acute Rehab PT Goals Patient Stated Goal: return home PT Goal Formulation: With patient Time For Goal Achievement: 06/14/20 Potential to Achieve Goals: Good Progress towards PT goals: Progressing toward goals    Frequency    7X/week      PT Plan Current plan remains appropriate       AM-PAC PT "6 Clicks" Mobility   Outcome Measure  Help needed turning from your back to your side while in a flat bed without using bedrails?: A Little Help needed moving from lying on your back to sitting on the side of a flat bed without using bedrails?: A Little Help needed moving to and from a bed to a chair (including a wheelchair)?: A Little Help needed standing up from a chair using your arms (e.g., wheelchair or bedside chair)?: A Little Help needed to walk in hospital room?: A Little Help needed climbing 3-5 steps with a railing? : A Little 6 Click Score: 18    End of Session Equipment Utilized During Treatment: Gait belt Activity Tolerance: Patient tolerated treatment well Patient left: in chair;with call bell/phone within reach Nurse Communication: Mobility status PT Visit Diagnosis: Other abnormalities of gait and mobility (R26.89);Pain Pain - Right/Left: Right Pain - part of body: Knee     Time: 7341-9379 PT Time Calculation (min) (ACUTE ONLY): 39 min  Charges:  $Gait Training: 8-22 mins $Therapeutic Exercise: 8-22 mins $Therapeutic Activity: 8-22 mins                     Zachary Bates, PT, DPT   Acute Rehabilitation Department Pager #: [phone]   Zachary Bates 06/02/2020, 4:40 PM

## 2020-06-02 NOTE — Progress Notes (Signed)
Physical Therapy Treatment Patient Details Name: Zachary Bates MRN: [id number] DOB: September 05, 1975 Today's Date: 06/02/2020    History of Present Illness Pt is a 44 y/o male s/p R TKA. Pt with no pertinent PMH.     PT Comments    The pt was able to make good progress with mobility and PT goals this morning. He was able to complete navigation of 2 steps as needed to enter/exit his home with minG for safety and cues for technique. Both the pt and his wife expressed understanding regarding training and education. The pt was also able to complete good ambulation with RW to return to his room, but continues to rely on step-to gait due to pain with RLE during stance and will continue to benefit from skilled PT to progress functional ROM, strength, and endurance for improved mobility in the home.     Follow Up Recommendations  Follow surgeon's recommendation for DC plan and follow-up therapies ((OPPT))     Equipment Recommendations  None recommended by PT    Recommendations for Other Services       Precautions / Restrictions Precautions Precautions: Knee Precaution Booklet Issued: Yes (comment) Precaution Comments: Verbally reviewed knee precautions with pt.  Restrictions Weight Bearing Restrictions: Yes RLE Weight Bearing: Weight bearing as tolerated    Mobility  Bed Mobility Overal bed mobility: Modified Independent             General bed mobility comments: Modified Independent with pt independently navigating LE to EOB  Transfers Overall transfer level: Needs assistance Equipment used: Rolling walker (2 wheeled) Transfers: Sit to/from Stand Sit to Stand: Modified independent (Device/Increase time)         General transfer comment: increased time and some assist for set up (bringing AD close, moving chair LE had been resting on), but pt able to then stand without physical assist  Ambulation/Gait Ambulation/Gait assistance: Min guard Gait Distance (Feet): 50  Feet Assistive device: Rolling walker (2 wheeled) Gait Pattern/deviations: Step-to pattern;Decreased stance time - right;Decreased dorsiflexion - right;Antalgic Gait velocity: decreased Gait velocity interpretation: <1.31 ft/sec, indicative of household ambulator General Gait Details: painful in RLE, but able to manage with significant UE support   Stairs Stairs: Yes Stairs assistance: Min guard Stair Management: One rail Right;Sideways;Step to pattern Number of Stairs: 2 General stair comments: pt climbed with sideways step-to. wife present and educated on technique. all voiced understanding and agreement.     Balance Overall balance assessment: Needs assistance Sitting-balance support: No upper extremity supported;Feet supported Sitting balance-Leahy Scale: Good     Standing balance support: No upper extremity supported Standing balance-Leahy Scale: Fair Standing balance comment: fair static standing unsupported, need for B UE support for balance and due to LE pain                            Cognition Arousal/Alertness: Awake/alert Behavior During Therapy: WFL for tasks assessed/performed Overall Cognitive Status: Within Functional Limits for tasks assessed                                 General Comments: pt and wife agreeable to all education      Exercises      General Comments General comments (skin integrity, edema, etc.): pt on CMP upon PT arrival, returned to legs elevated in recliner      Pertinent Vitals/Pain Pain Assessment: Faces Faces Pain Scale:  Hurts a little bit Pain Location: R knee  Pain Descriptors / Indicators: Aching;Operative site guarding;Grimacing;Guarding Pain Intervention(s): Limited activity within patient's tolerance;Monitored during session;Repositioned           PT Goals (current goals can now be found in the care plan section) Acute Rehab PT Goals Patient Stated Goal: to decrease pain  PT Goal  Formulation: With patient Time For Goal Achievement: 06/14/20 Potential to Achieve Goals: Good Progress towards PT goals: Progressing toward goals    Frequency    7X/week      PT Plan Current plan remains appropriate       AM-PAC PT "6 Clicks" Mobility   Outcome Measure  Help needed turning from your back to your side while in a flat bed without using bedrails?: A Little Help needed moving from lying on your back to sitting on the side of a flat bed without using bedrails?: A Little Help needed moving to and from a bed to a chair (including a wheelchair)?: A Little Help needed standing up from a chair using your arms (e.g., wheelchair or bedside chair)?: A Little Help needed to walk in hospital room?: A Little Help needed climbing 3-5 steps with a railing? : A Little 6 Click Score: 18    End of Session Equipment Utilized During Treatment: Gait belt Activity Tolerance: Patient tolerated treatment well     PT Visit Diagnosis: Other abnormalities of gait and mobility (R26.89);Pain Pain - Right/Left: Right Pain - part of body: Knee     Time: 2376-2831 PT Time Calculation (min) (ACUTE ONLY): 25 min  Charges:  $Gait Training: 8-22 mins $Therapeutic Activity: 8-22 mins                     Rolm Baptise, PT, DPT   Acute Rehabilitation Department Pager #: [phone]   Gaetana Michaelis 06/02/2020, 4:28 PM

## 2020-06-02 NOTE — Evaluation (Signed)
Occupational Therapy Evaluation/Discharge Patient Details Name: Zachary Bates [facility] MRN: [id number] DOB: January 01, 1976 Today's Date: 06/02/2020    History of Present Illness Pt is a 44 y/o male s/p R TKA. Pt with no pertinent PMH.    Clinical Impression   PTA, pt lives with wife and young children. Pt works full time and is independent in all daily tasks without use of AD. Pt presents now with mild post-op pain (premedicated prior to session). Pt requires Min A for LB ADLs due to difficulty managing around operative LE. Educated on use of AE or family assist at home for tasks, as well as safety during ADLs. Pt able to demo bed mobility, transfers at [employer] using RW, supervision for mobility for safety. Pt verbalized understanding of all education, has all needed DME and good family support at home. No further skilled OT services needed at this time. OT to sign off.     Follow Up Recommendations  No OT follow up;Supervision - Intermittent    Equipment Recommendations  None recommended by OT    Recommendations for Other Services       Precautions / Restrictions Precautions Precautions: Knee Precaution Booklet Issued: No Precaution Comments: Verbally reviewed knee precautions with pt.  Restrictions Weight Bearing Restrictions: Yes RLE Weight Bearing: Weight bearing as tolerated      Mobility Bed Mobility Overal bed mobility: Modified Independent Bed Mobility: Supine to Sit;Sit to Supine     Supine to sit: Modified independent (Device/Increase time);HOB elevated Sit to supine: Modified independent (Device/Increase time)   General bed mobility comments: Modified Independent with pt independently navigating LE to EOB    Transfers Overall transfer level: Needs assistance Equipment used: Rolling walker (2 wheeled) Transfers: Sit to/from Stand Sit to Stand: Modified independent (Device/Increase time)         General transfer comment: Good hand placement, no cues  needed for sequencing task. Able to demo mobility with RW Supervision and minor cues for smooth movements and sequencing RW    Balance Overall balance assessment: Needs assistance Sitting-balance support: No upper extremity supported;Feet supported Sitting balance-Leahy Scale: Good     Standing balance support: Bilateral upper extremity supported;Single extremity supported;During functional activity Standing balance-Leahy Scale: Fair Standing balance comment: fair static standing unsupported, need for B UE support for balance and due to LE pain                           ADL either performed or assessed with clinical judgement   ADL Overall ADL's : Needs assistance/impaired Eating/Feeding: Independent;Sitting   Grooming: Modified independent;Standing   Upper Body Bathing: Independent;Sitting   Lower Body Bathing: Minimal assistance;Sitting/lateral leans;Sit to/from stand   Upper Body Dressing : Independent;Sitting   Lower Body Dressing: Minimal assistance;Sit to/from stand;Sitting/lateral leans Lower Body Dressing Details (indicate cue type and reason): Min A for mgmt of clothing around operative LE as expected. Educated on AE that can be used or family assist as needed. Educated on safety, to perform LB ADls seated Toilet Transfer: Supervision/safety;Ambulation;BSC;RW Toilet Transfer Details (indicate cue type and reason): Simulated in room, no LOB or safety concerns Toileting- Clothing Manipulation and Hygiene: Modified independent;Sit to/from stand;Sitting/lateral lean       Functional mobility during ADLs: Supervision/safety;Rolling walker;Cueing for sequencing General ADL Comments: Pt with typical post op difficulty reaching around operative LE with pain.     Vision Baseline Vision/History: No visual deficits Patient Visual Report: No change from baseline Vision Assessment?: No apparent  visual deficits     Perception     Praxis      Pertinent Vitals/Pain  Pain Assessment: Faces Faces Pain Scale: Hurts little more Pain Location: R knee  Pain Descriptors / Indicators: Aching;Operative site guarding;Grimacing;Guarding Pain Intervention(s): Monitored during session     Hand Dominance Right   Extremity/Trunk Assessment Upper Extremity Assessment Upper Extremity Assessment: Overall WFL for tasks assessed   Lower Extremity Assessment Lower Extremity Assessment: Defer to PT evaluation   Cervical / Trunk Assessment Cervical / Trunk Assessment: Normal   Communication Communication Communication: No difficulties   Cognition Arousal/Alertness: Awake/alert Behavior During Therapy: WFL for tasks assessed/performed Overall Cognitive Status: Within Functional Limits for tasks assessed                                     General Comments  Pt placed back on CPM at end of OT session    Exercises     Shoulder Instructions      Home Living Family/patient expects to be discharged to:: Private residence Living Arrangements: Spouse/significant other;Children Available Help at Discharge: Family;Available 24 hours/day Type of Home: House Home Access: Stairs to enter [employer] of Steps: 2 Entrance Stairs-Rails: Right;Left Home Layout: One level     Bathroom Shower/Tub: Producer, television/film/video: Handicapped height     Home Equipment: Environmental consultant - 2 wheels;Other (comment);Shower seat - built in (CPM)   Additional Comments: Reports he plans to have a ramp built hopefully before he gets home      Prior Functioning/Environment Level of Independence: Independent        Comments: Works Journalist, newspaper for tires in [place]        OT Problem List: Pain      OT Treatment/Interventions:      OT Goals(Current goals can be found in the care plan section) Acute Rehab OT Goals Patient Stated Goal: to decrease pain  OT Goal Formulation: With patient  OT Frequency:     Barriers to D/C:             Co-evaluation              AM-PAC OT "6 Clicks" Daily Activity     Outcome Measure Help from another person eating meals?: None Help from another person taking care of personal grooming?: None Help from another person toileting, which includes using toliet, bedpan, or urinal?: A Little Help from another person bathing (including washing, rinsing, drying)?: A Little Help from another person to put on and taking off regular upper body clothing?: None Help from another person to put on and taking off regular lower body clothing?: A Little 6 Click Score: 21   End of Session Equipment Utilized During Treatment: Gait belt;Rolling walker CPM Right Knee CPM Right Knee: On (placed by OT) Right Knee Flexion (Degrees): 10 Right Knee Extension (Degrees): 40 Nurse Communication: Mobility status  Activity Tolerance: Patient tolerated treatment well Patient left: in bed;with call bell/phone within reach;in CPM  OT Visit Diagnosis: Pain Pain - Right/Left: Right Pain - part of body: Knee                Time: 3500-9381 OT Time Calculation (min): 32 min Charges:  OT General Charges $OT Visit: 1 Visit OT Evaluation $OT Eval Low Complexity: 1 Low OT Treatments $Self Care/Home Management : 8-22 mins  Lorre Munroe, OTR/L  Lorre Munroe 06/02/2020, 9:03 AM

## 2020-06-02 NOTE — TOC Initial Note (Addendum)
Transition of Care [facility]) - Initial/Assessment Note    Patient Details  Name: Zachary Bates MRN: [id number] Date of Birth: Nov 16, 1975  [facility] CM/SW Contact:    Janae Bridgeman, RN Phone Number: 06/02/2020, 11:23 AM  Clinical Narrative:                 Case management met with the patient and wife at the bedside regarding transitions of care to home.  The patient is S/P Right total knee arthroplasty by Dr. August Saucer.  The patient's wife was given choice regarding home health services and the wife did not have a preference.  I called [facility], [facility], and [facility] and they are unable to provide services in the area to the patient.  I called and spoke with Kandee Keen, [facility] with [facility] and I'm waiting on confirmation for services.  No dme is needed for discharge to home. Will continue to follow for home health needs.  12/9/2021[facility], [facility] were both unable to provide home health.  I faxed two more referrals to both [facility] and [facility] health and I'm waiting to hear back about possible home health PT  - otherwise the patient may have to be set up with [facility] in [place], [place].  Karenann Cai, [place] and Dr. August Saucer were notified and I will contact them for outpatient order if needed before the patient discharges to home.  I spoke with the patient and wife and they are both aware.  06/02/2020 - 1558- I spoke with [facility] and the patient is set up for outpatient PT starting tomorrow.  The facility is calling the patient's wife now to clarify a time.  The patient's wife is aware that either [facility] or [facility] was undecided as to their ability to provide home health services - but both agencies were faxed needed clinicals and home health orders.    The patient may be discharged home today with his wife.  Expected Discharge Plan: Home w Home Health Services Barriers to Discharge: No Barriers Identified   Patient Goals and CMS  Choice Patient states their goals for this hospitalization and ongoing recovery are:: Patient plans to discharge home with his wife today. CMS Medicare.gov Compare Post Acute Care list provided to:: Patient Choice offered to / list presented to : Patient  Expected Discharge Plan and Services Expected Discharge Plan: Home w Home Health Services   Discharge Planning Services: CM Consult Post Acute Care Choice: Home Health Living arrangements for the past 2 months: Single Family Home                 DME Arranged:  (No dme needs)         HH Arranged: PT HH Agency: [facility] Date [facility] Agency Contacted: 06/02/20 Time HH Agency Contacted: 1122 Representative spoke with at [facility] Agency: Kandee Keen, RNCM with [facility]  Prior Living Arrangements/Services Living arrangements for the past 2 months: Single Family Home Lives with:: Spouse Patient language and need for interpreter reviewed:: Yes Do you feel safe going back to the place where you live?: Yes      Need for Family Participation in Patient Care: Yes (Comment) Care giver support system in place?: Yes (comment) Current home services: DME Criminal Activity/Legal Involvement Pertinent to Current Situation/Hospitalization: No - Comment as needed  Activities of Daily Living Home Assistive Devices/Equipment: Dan Humphreys (specify type) ADL Screening (condition at time of admission) Patient's cognitive ability adequate to safely complete  daily activities?: Yes Is the patient deaf or have difficulty hearing?: No Does the patient have difficulty seeing, even when wearing glasses/contacts?: No Does the patient have difficulty concentrating, remembering, or making decisions?: No Patient able to express need for assistance with ADLs?: Yes Does the patient have difficulty dressing or bathing?: No Independently performs ADLs?: Yes (appropriate for developmental age) Does the patient have difficulty walking or climbing stairs?: Yes Weakness of  Legs: Right Weakness of Arms/Hands: None  Permission Sought/Granted Permission sought to share information with : Case Manager Permission granted to share information with : Yes, Verbal Permission Granted     Permission granted to share info w AGENCY: home health providers in [place], [place] granted to share info w Relationship: spouse - Anderson Malta     Emotional Assessment Appearance:: Appears stated age Attitude/Demeanor/Rapport: Engaged Affect (typically observed): Accepting Orientation: : Oriented to Self,Oriented to Place,Oriented to  Time,Oriented to Situation Alcohol / Substance Use: Not Applicable Psych Involvement: No (comment)  Admission diagnosis:  S/P total knee arthroplasty, right [Z96.651] Patient Active Problem List   Diagnosis Date Noted  . S/P total knee arthroplasty, right 05/31/2020   PCP:  Patient, No Pcp Per Pharmacy:   [id number] [place], [place]. [place] [address] Phone: [phone] Fax: [phone]  [facility] - [place], [place] - [place] [address] [place] [address] Phone: [phone] Fax: [phone]     Social Determinants of Health (SDOH) Interventions    Readmission Risk Interventions No flowsheet data found.

## 2020-06-02 NOTE — Progress Notes (Signed)
Provided discharge education/instructions, all questions and concerns addressed, Pt not in distress, discharged home with belongings accompanied by wife. 

## 2020-06-02 NOTE — Progress Notes (Signed)
Faxed order for outpatient PT to [phone] per Luke's request

## 2020-06-02 NOTE — Progress Notes (Signed)
Patient stable Pain controlled overall and he has progressed well with physical therapy.  He was able to do 2 stairs in PT earlier this morning.  He has 2 stairs again this as well as a ramp to enter his home. He feels he is ready for discharge home. On exam he has postop dressing with no gross bleeding or drainage.  Peroneal nerve intact with intact dorsiflexion of the ankle as well as dorsal sensation to the right foot.  2+ DP pulse. Plan to discharge to home today.  Follow-up with Dr. August Saucer 2 weeks after surgery.  Okay to discharge after he receives blue cradle boot from [employer].

## 2020-06-02 NOTE — Telephone Encounter (Signed)
Patient's wife Victorino Dike submitted medical release form signed by patient, Mena Pauls short term disability, and $25.00 cash payment.Accepted 06/02/2020.

## 2020-06-02 NOTE — Plan of Care (Signed)

## 2020-06-02 NOTE — Progress Notes (Signed)
Orthopedic Tech Progress Note Patient Details:  Fabrice Dyal [facility] Mar 10, 1976 [id number]  Ortho Devices Type of Ortho Device: Bone foam zero knee Ortho Device/Splint Location: RLE Ortho Device/Splint Interventions: Ordered   Post Interventions Patient Tolerated: Well Instructions Provided: Care of device,Adjustment of device   Elpidio Thielen A Andric Kerce 06/02/2020, 1:13 PM

## 2020-06-15 ENCOUNTER — Ambulatory Visit (INDEPENDENT_AMBULATORY_CARE_PROVIDER_SITE_OTHER): Payer: BC Managed Care – PPO | Admitting: Orthopedic Surgery

## 2020-06-15 ENCOUNTER — Ambulatory Visit (INDEPENDENT_AMBULATORY_CARE_PROVIDER_SITE_OTHER): Payer: BC Managed Care – PPO

## 2020-06-15 DIAGNOSIS — Z96651 Presence of right artificial knee joint: Secondary | ICD-10-CM

## 2020-06-15 MED ORDER — OXYCODONE HCL 5 MG PO TABS: 5.0000 mg | ORAL_TABLET | Freq: Four times a day (QID) | ORAL | 0 refills | Status: DC | PRN

## 2020-06-16 ENCOUNTER — Encounter: Payer: Self-pay | Admitting: Orthopedic Surgery

## 2020-06-16 NOTE — Discharge Summary (Signed)
Physician Discharge Summary      Patient ID: Zachary Bates [facility] MRN: [id number] DOB/AGE: Oct 06, 1975 44 y.o.  Admit date: 05/31/2020 Discharge date: 06/02/20  Admission Diagnoses:  Active Problems:   S/P total knee arthroplasty, right   Discharge Diagnoses:  Same  Surgeries: Procedure(s): RIGHT TOTAL KNEE ARTHROPLASTY on 05/31/2020   Consultants:   Discharged Condition: Stable  Hospital Course: Zachary Bates is an 44 y.o. male who was admitted 05/31/2020 with a chief complaint of right knee pain, and found to have a diagnosis of right knee arthritis.  They were brought to the operating room on 05/31/2020 and underwent the above named procedures.  Pt awoke from anesthesia without complication and was transferred to the floor. On POD1, patient had difficulties with mobility and pain control. He improved significantly on POD2 and was subsequently discharged home.  Pt will f/u with Dr. August Saucer in clinic in ~2 weeks.   Antibiotics given:  Anti-infectives (From admission, onward)   Start     Dose/Rate Route Frequency Ordered Stop   05/31/20 1600  ceFAZolin (ANCEF) IVPB 2g/100 mL premix        2 g 200 mL/hr over 30 Minutes Intravenous Every 8 hours 05/31/20 1404 05/31/20 2217   05/31/20 0600  ceFAZolin (ANCEF) IVPB 2g/100 mL premix        2 g 200 mL/hr over 30 Minutes Intravenous On call to O.R. 05/31/20 4008 05/31/20 0758    .  Recent vital signs:  Vitals:   06/02/20 0834 06/02/20 1435  BP: 136/80 138/81  Pulse: 70 98  Resp: 16 17  Temp: 98.1 F (36.7 C) 98.4 F (36.9 C)  SpO2: 98% 95%    Recent laboratory studies:  Results for orders placed or performed during the hospital encounter of 05/27/20  SARS CORONAVIRUS 2 (TAT 6-24 HRS) Nasopharyngeal Nasopharyngeal Swab   Specimen: Nasopharyngeal Swab  Result Value Ref Range   SARS Coronavirus 2 NEGATIVE NEGATIVE    Discharge Medications:   Allergies as of 06/02/2020      Reactions   Alpha-gal    pork      Medication List     STOP taking these medications   ibuprofen 200 MG tablet Commonly known as: ADVIL     TAKE these medications   acetaminophen 500 MG tablet Commonly known as: TYLENOL Take 1,000 mg by mouth every 8 (eight) hours as needed for moderate pain.   ascorbic acid 500 MG tablet Commonly known as: VITAMIN C Take 500 mg by mouth daily. Notes to patient: Resume home regimen   aspirin 81 MG chewable tablet Chew 1 tablet (81 mg total) by mouth daily.   celecoxib 200 MG capsule Commonly known as: CELEBREX Take 1 capsule (200 mg total) by mouth 2 (two) times daily.   cholecalciferol 25 MCG (1000 UNIT) tablet Commonly known as: VITAMIN D3 Take 1,000 Units by mouth daily. Notes to patient: Resume home regimen   Diclofenac Sodium 1.5 % Soln Place 150 mLs onto the skin every 4 (four) hours. Apply to affected area every 4-6 hours as needed What changed:   when to take this  reasons to take this Notes to patient: Resume home regimen   methocarbamol 500 MG tablet Commonly known as: ROBAXIN Take 1 tablet (500 mg total) by mouth every 8 (eight) hours as needed for muscle spasms. Notes to patient: Last dose given 12/09 06:50am   multivitamin with minerals tablet Take 1 tablet by mouth daily. Notes to patient: Resume home regimen   zinc  gluconate 50 MG tablet Take 50 mg by mouth daily. Notes to patient: Resume home regimen       Diagnostic Studies: DG Knee Right Port  Result Date: 05/31/2020 CLINICAL DATA:  Status post right knee replacement. EXAM: PORTABLE RIGHT KNEE - 1-2 VIEW COMPARISON:  May 06, 2020. FINDINGS: The right femoral and acetabular components appear to be well situated. Expected postoperative changes are noted in the soft tissues anteriorly and laterally. IMPRESSION: Status post right total knee arthroplasty. Electronically Signed   By: Lupita Raider M.D.   On: 05/31/2020 12:44   XR Knee 1-2 Views Right  Result Date: 06/16/2020 AP lateral right knee reviewed.   Total knee prosthesis in good position alignment with no complicating features.  Overall less valgus alignment compared to preoperative radiographs.   Disposition: Discharge disposition: 01-Home or Self Care       Discharge Instructions    Call MD / Call 911   Complete by: As directed    If you experience chest pain or shortness of breath, CALL 911 and be transported to the hospital emergency room.  If you develope a fever above 101 F, pus (white drainage) or increased drainage or redness at the wound, or calf pain, call your surgeon's office.   Constipation Prevention   Complete by: As directed    Drink plenty of fluids.  Prune juice may be helpful.  You may use a stool softener, such as Colace (over the counter) 100 mg twice a day.  Use MiraLax (over the counter) for constipation as needed.   Diet - low sodium heart healthy   Complete by: As directed    Discharge instructions   Complete by: As directed    You may shower, dressing is waterproof.  Do not remove the dressing, we will remove it at your first post-op appointment.  Do not take a bath or soak the knee in a tub or pool.  You may weightbear as you can tolerate on the operative leg with a walker.  Continue using the CPM machine 3 times per day for one hour each time, increasing the degrees of range of motion daily.  Use the blue cradle boot under your heel to work on getting your leg straight.  Do NOT put a pillow under your knee.  You will follow-up with Dr. August Saucer in the clinic in 2 weeks at your given appointment date.  Call the office at [phone] with any questions or concerns.    Dental Antibiotics:  In most cases prophylactic antibiotics for Dental procdeures after total joint surgery are not necessary.  Exceptions are as follows:  1. History of prior total joint infection  2. Severely immunocompromised (Organ Transplant, cancer chemotherapy, Rheumatoid biologic meds such as Humera)  3. Poorly controlled diabetes  (A1C &gt; 8.0, blood glucose over 200)  If you have one of these conditions, contact your surgeon for an antibiotic prescription, prior to your dental procedure.   Increase activity slowly as tolerated   Complete by: As directed        Follow-up Information    [place], August Summer, [place]. Schedule an appointment as soon as possible for a visit.   Specialty: Family Medicine Why: Please follow up with your primary care in the next 7-10 days for hospital follow up. Contact information: [address] [place] [address] [phone]        [facility], [facility] up.   Specialties: Physical Therapy, Occupational Therapy Why: [facility] will be  calling you to set up your outpatient therapy starting tomorrow, 06/02/2020.  I faxed all your clinicals to the facility and Karenann Cai is sending your prescription for outpatient services for start of care. Contact information: [address] [place] [address] [phone]        Cammy Copa, MD. Call.   Specialty: Orthopedic Surgery Why: Please call and schedule your hospital follow up for your postoperative check. Contact information: [address] [place] [address] [phone]        Orthopedic dme Follow up.   Why: Please send the blue bone foam home with the patient.               Signed: Julieanne Cotton 06/16/2020, 9:22 AM

## 2020-06-16 NOTE — Progress Notes (Signed)
   Post-Op Visit Note   Patient: Zachary Bates [facility]           Date of Birth: Feb 16, 1976           MRN: [id number] Visit Date: 06/15/2020 PCP: Patient, No Pcp Per   Assessment & Plan:  Chief Complaint:  Chief Complaint  Patient presents with  . Right Knee - Routine Post Op   Visit Diagnoses:  1. Status post total right knee replacement     Plan: Josef is a patient who is now 2 weeks out right total knee replacement.  95 degrees on CPM.  Negative Homans no calf tenderness.  Collaterals feel stable.  Stable to varus and valgus stress at 0 30 and 90 degrees.  He has been doing outpatient physical therapy.  He needs to be out of work 12 weeks total from the time of his surgery which was 05/31/2020.  I will see him back in 4 weeks for clinical recheck.  Radiographs look good today.  Oxycodone refilled.  Follow-Up Instructions: No follow-ups on file.   Orders:  Orders Placed This Encounter  Procedures  . XR Knee 1-2 Views Right   Meds ordered this encounter  Medications  . oxyCODONE (OXY IR/ROXICODONE) 5 MG immediate release tablet    Sig: Take 1 tablet (5 mg total) by mouth every 6 (six) hours as needed for moderate pain (pain score 4-6).    Dispense:  30 tablet    Refill:  0    Imaging: No results found.  PMFS History: Patient Active Problem List   Diagnosis Date Noted  . S/P total knee arthroplasty, right 05/31/2020   Past Medical History:  Diagnosis Date  . Abscess 2009   Left 5th phalange     No family history on file.  Past Surgical History:  Procedure Laterality Date  . CYST EXCISION Left    Left 5th phalange  . LAPAROSCOPIC GASTRIC BYPASS  2008  . TOTAL KNEE ARTHROPLASTY Right 05/31/2020   Procedure: RIGHT TOTAL KNEE ARTHROPLASTY;  Surgeon: Cammy Copa, MD;  Location: [facility] OR;  Service: Orthopedics;  Laterality: Right;   Social History   Occupational History  . Not on file  Tobacco Use  . Smoking status: Never Smoker  . Smokeless tobacco: Never Used   Vaping Use  . Vaping Use: Never used  Substance and Sexual Activity  . Alcohol use: Not Currently  . Drug use: Never  . Sexual activity: Yes

## 2020-06-24 ENCOUNTER — Other Ambulatory Visit: Payer: Self-pay | Admitting: Surgical

## 2020-06-27 ENCOUNTER — Telehealth: Payer: Self-pay | Admitting: Orthopedic Surgery

## 2020-06-27 NOTE — Telephone Encounter (Signed)
[employer] forms received. Sent to [employer] 

## 2020-06-27 NOTE — Telephone Encounter (Signed)
Pls advise. Thanks.  

## 2020-07-01 ENCOUNTER — Other Ambulatory Visit: Payer: Self-pay | Admitting: Surgical

## 2020-07-01 ENCOUNTER — Telehealth: Payer: Self-pay

## 2020-07-01 MED ORDER — METHOCARBAMOL 500 MG PO TABS: 500.0000 mg | ORAL_TABLET | Freq: Three times a day (TID) | ORAL | 0 refills | Status: DC | PRN

## 2020-07-01 MED ORDER — CELECOXIB 200 MG PO CAPS
200.0000 mg | ORAL_CAPSULE | Freq: Two times a day (BID) | ORAL | 0 refills | Status: DC
Start: 2020-07-01 — End: 2020-08-05

## 2020-07-01 NOTE — Telephone Encounter (Signed)
Sent in and called patient

## 2020-07-01 NOTE — Telephone Encounter (Signed)
Pls advise.  

## 2020-07-01 NOTE — Telephone Encounter (Signed)
Patient came in he is requesting a refill for methocarbamol and Celebrex CB:[phone]

## 2020-07-14 ENCOUNTER — Ambulatory Visit (INDEPENDENT_AMBULATORY_CARE_PROVIDER_SITE_OTHER): Payer: BC Managed Care – PPO | Admitting: Orthopedic Surgery

## 2020-07-14 ENCOUNTER — Telehealth: Payer: Self-pay

## 2020-07-14 ENCOUNTER — Other Ambulatory Visit: Payer: Self-pay

## 2020-07-14 DIAGNOSIS — Z96651 Presence of right artificial knee joint: Secondary | ICD-10-CM

## 2020-07-14 NOTE — Telephone Encounter (Signed)
Pt would like to speak with Dr August Saucer or Franky Macho regarding dates for returning to work

## 2020-07-14 NOTE — Telephone Encounter (Signed)
IC s/w patient. He is asking for his work note to be extended further. I did ask what specific date he was requesting. He said that he would need to get with his wife and get back with [place] to confirm. He will call us later this afternoon or tomorrow with a date that he is asking for his work note to be extended to.

## 2020-07-14 NOTE — Telephone Encounter (Signed)
I called pt and gave her and offered her an appt Tuesday at 2:00

## 2020-07-15 ENCOUNTER — Encounter: Payer: Self-pay | Admitting: Orthopedic Surgery

## 2020-07-15 NOTE — Progress Notes (Signed)
   Post-Op Visit Note   Patient: Zachary [facility]           Date of Birth: 26-Feb-1976           MRN: [id number] Visit Date: 07/14/2020 PCP: Patient, No Pcp Per   Assessment & Plan:  Chief Complaint:  Chief Complaint  Patient presents with  . Right Knee - Routine Post Op   Visit Diagnoses: No diagnosis found.  Plan: Patient is a 45 year old male who presents s/p right total knee arthroplasty on 05/31/2020.  States that he is doing well and pain is overall well controlled.  He does not have to take any of the pain medication any longer.  Denies any fevers, chills, drainage from the incision.  Denies any instability events.  He is going to physical therapy 3 times per week in [place].  On exam he has 0 degrees of extension and 105 degrees of flexion.  He reaches 115 degrees of flexion in physical therapy.  Incision is healing well without any evidence of infection or dehiscence.  Able to perform multiple straight leg raises easily.  Mild quad atrophy compared with the contralateral side.  No calf tenderness.  Negative Homans' sign.  Dorsiflexion intact to the right ankle.  Overall patient is progressing well.  Okay to reduce physical therapy frequency to 2 times per week.  Provided work note to keep patient out of work until 08/29/2020 which will be 3 months after his surgery.  He needs a full 3 months of rehab before he will be able to return to work.  Follow-up in 6 weeks for final check with Dr. August Saucer.  Follow-Up Instructions: No follow-ups on file.   Orders:  No orders of the defined types were placed in this encounter.  No orders of the defined types were placed in this encounter.   Imaging: No results found.  PMFS History: Patient Active Problem List   Diagnosis Date Noted  . S/P total knee arthroplasty, right 05/31/2020   Past Medical History:  Diagnosis Date  . Abscess 2009   Left 5th phalange     No family history on file.  Past Surgical History:  Procedure Laterality Date   . CYST EXCISION Left    Left 5th phalange  . LAPAROSCOPIC GASTRIC BYPASS  2008  . TOTAL KNEE ARTHROPLASTY Right 05/31/2020   Procedure: RIGHT TOTAL KNEE ARTHROPLASTY;  Surgeon: Cammy Copa, MD;  Location: [facility] OR;  Service: Orthopedics;  Laterality: Right;   Social History   Occupational History  . Not on file  Tobacco Use  . Smoking status: Never Smoker  . Smokeless tobacco: Never Used  Vaping Use  . Vaping Use: Never used  Substance and Sexual Activity  . Alcohol use: Not Currently  . Drug use: Never  . Sexual activity: Yes

## 2020-07-18 ENCOUNTER — Telehealth: Payer: Self-pay | Admitting: Orthopedic Surgery

## 2020-07-18 NOTE — Telephone Encounter (Signed)
I called patient. He would like to extend his out of work time to 09/26/2020.  Originally 08/29/2020 was discussed. He states that you all spoke about this, and you were waiting for his call. He would like a call back if this can be done.

## 2020-07-18 NOTE — Telephone Encounter (Signed)
I have discussed this with patient.

## 2020-07-18 NOTE — Telephone Encounter (Signed)
Talked with Dr August Saucer He did not feel comfortable extending note this soon.  He suggested that patient keep scheduled follow up appointment and he will discuss work status at that time after he re-evaluates patient at follow up visit.

## 2020-07-18 NOTE — Telephone Encounter (Signed)
Pt called and needs you to call him about his return to work dates, please give him a call at  [phone]

## 2020-07-26 ENCOUNTER — Other Ambulatory Visit: Payer: Self-pay | Admitting: Surgical

## 2020-07-27 NOTE — Telephone Encounter (Signed)
Please advise 

## 2020-08-04 ENCOUNTER — Other Ambulatory Visit: Payer: Self-pay | Admitting: Surgical

## 2020-08-04 NOTE — Telephone Encounter (Signed)
Please advise. Thanks.  

## 2020-08-24 ENCOUNTER — Ambulatory Visit (INDEPENDENT_AMBULATORY_CARE_PROVIDER_SITE_OTHER): Payer: BC Managed Care – PPO | Admitting: Orthopedic Surgery

## 2020-08-24 DIAGNOSIS — Z96651 Presence of right artificial knee joint: Secondary | ICD-10-CM

## 2020-08-24 MED ORDER — METHOCARBAMOL 500 MG PO TABS: 500.0000 mg | ORAL_TABLET | Freq: Three times a day (TID) | ORAL | 0 refills | Status: DC | PRN

## 2020-08-25 ENCOUNTER — Encounter: Payer: Self-pay | Admitting: Orthopedic Surgery

## 2020-08-25 NOTE — Progress Notes (Signed)
   Post-Op Visit Note   Patient: Zachary Bates [facility]           Date of Birth: 1975-07-28           MRN: [id number] Visit Date: 08/24/2020 PCP: Patient, No Pcp Per   Assessment & Plan:  Chief Complaint:  Chief Complaint  Patient presents with  . Right Knee - Routine Post Op   Visit Diagnoses:  1. S/P total knee arthroplasty, right     Plan: Patient is a 45 year old male.  He is s/p right total knee arthroplasty on 05/31/2020.  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  He is going to physical therapy 2 times per week.  He has 0 degrees of extension and 115 degrees of flexion.  He has excellent quadricep strength with some mild atrophy compared with the contralateral leg.  He is continuing to progress with therapy.  No calf tenderness.  Negative Homans' sign.  Incision has healed well with no evidence of infection or dehiscence.  Plan to return to work in 1 month with follow-up in 8 weeks for final check to make sure that his return to work has gone smoothly.  Patient agreed with plan.  Follow-up in 8 weeks.  Follow-Up Instructions: No follow-ups on file.   Orders:  No orders of the defined types were placed in this encounter.  Meds ordered this encounter  Medications  . methocarbamol (ROBAXIN) 500 MG tablet    Sig: Take 1 tablet (500 mg total) by mouth every 8 (eight) hours as needed for muscle spasms.    Dispense:  30 tablet    Refill:  0    Imaging: No results found.  PMFS History: Patient Active Problem List   Diagnosis Date Noted  . S/P total knee arthroplasty, right 05/31/2020   Past Medical History:  Diagnosis Date  . Abscess 2009   Left 5th phalange     No family history on file.  Past Surgical History:  Procedure Laterality Date  . CYST EXCISION Left    Left 5th phalange  . LAPAROSCOPIC GASTRIC BYPASS  2008  .  TOTAL KNEE ARTHROPLASTY Right 05/31/2020   Procedure: RIGHT TOTAL KNEE ARTHROPLASTY;  Surgeon: Cammy Copa, MD;  Location: [facility] OR;  Service: Orthopedics;  Laterality: Right;   Social History   Occupational History  . Not on file  Tobacco Use  . Smoking status: Never Smoker  . Smokeless tobacco: Never Used  Vaping Use  . Vaping Use: Never used  Substance and Sexual Activity  . Alcohol use: Not Currently  . Drug use: Never  . Sexual activity: Yes

## 2020-08-29 ENCOUNTER — Other Ambulatory Visit: Payer: Self-pay | Admitting: Surgical

## 2020-09-21 ENCOUNTER — Other Ambulatory Visit: Payer: Self-pay | Admitting: Surgical

## 2020-09-21 NOTE — Telephone Encounter (Signed)
Please advise 

## 2020-09-29 DIAGNOSIS — K4090 Unilateral inguinal hernia, without obstruction or gangrene, not specified as recurrent: Secondary | ICD-10-CM | POA: Insufficient documentation

## 2020-09-30 ENCOUNTER — Telehealth: Payer: Self-pay

## 2020-09-30 NOTE — Telephone Encounter (Signed)
Faxed work note to [phone]

## 2020-11-23 ENCOUNTER — Ambulatory Visit (INDEPENDENT_AMBULATORY_CARE_PROVIDER_SITE_OTHER): Payer: BC Managed Care – PPO | Admitting: Orthopedic Surgery

## 2020-11-23 DIAGNOSIS — Z96651 Presence of right artificial knee joint: Secondary | ICD-10-CM

## 2020-12-03 ENCOUNTER — Encounter: Payer: Self-pay | Admitting: Orthopedic Surgery

## 2020-12-03 NOTE — Progress Notes (Signed)
   Post-Op Visit Note   Patient: Zachary Bates [facility]           Date of Birth: Dec 28, 1975           MRN: [id number] Visit Date: 11/23/2020 PCP: Patient, No Pcp Per (Inactive)   Assessment & Plan:  Chief Complaint:  Chief Complaint  Patient presents with   Right Knee - Follow-up   Visit Diagnoses:  1. S/P total knee arthroplasty, right     Plan: Yohannes is a 45 year old patient who is now several months out from left total knee replacement.  He has been doing well.  He is back at work.  On exam he is got very good range of motion with no effusion.  Flexes to about 110.  Alignment intact.  Plan is to continue with leg strengthening exercises.  Avoid unnecessary loadbearing.  I want this knee to last him for very long time.  He will follow-up with [place] as needed.  Need for antibiotic prophylaxis before any type of invasive procedure for the next 2 years discussed.  Follow-Up Instructions: Return if symptoms worsen or fail to improve.   Orders:  No orders of the defined types were placed in this encounter.  No orders of the defined types were placed in this encounter.   Imaging: No results found.  PMFS History: Patient Active Problem List   Diagnosis Date Noted   S/P total knee arthroplasty, right 05/31/2020   Past Medical History:  Diagnosis Date   Abscess 2009   Left 5th phalange     History reviewed. No pertinent family history.  Past Surgical History:  Procedure Laterality Date   CYST EXCISION Left    Left 5th phalange   LAPAROSCOPIC GASTRIC BYPASS  2008   TOTAL KNEE ARTHROPLASTY Right 05/31/2020   Procedure: RIGHT TOTAL KNEE ARTHROPLASTY;  Surgeon: Cammy Copa, MD;  Location: [facility] OR;  Service: Orthopedics;  Laterality: Right;   Social History   Occupational History   Not on file  Tobacco Use   Smoking status: Never   Smokeless tobacco: Never  Vaping Use   Vaping Use: Never used  Substance and Sexual Activity   Alcohol use: Not Currently   Drug use: Never    Sexual activity: Yes

## 2020-12-17 ENCOUNTER — Other Ambulatory Visit: Payer: Self-pay | Admitting: Surgical

## 2021-03-06 ENCOUNTER — Other Ambulatory Visit: Payer: Self-pay | Admitting: Surgical

## 2021-06-13 ENCOUNTER — Other Ambulatory Visit: Payer: Self-pay | Admitting: Surgical

## 2021-09-08 ENCOUNTER — Other Ambulatory Visit: Payer: Self-pay | Admitting: Surgical

## 2021-11-22 ENCOUNTER — Other Ambulatory Visit: Payer: Self-pay | Admitting: Surgical

## 2022-02-18 ENCOUNTER — Other Ambulatory Visit: Payer: Self-pay | Admitting: Surgical

## 2022-07-26 ENCOUNTER — Other Ambulatory Visit: Payer: Self-pay | Admitting: Surgical

## 2022-07-27 NOTE — Telephone Encounter (Signed)
It's been more than one year since he has been seen or had labs so he needs to request this from his PCP or someone who sees him more regularly

## 2022-08-14 IMAGING — DX DG KNEE 1-2V PORT*R*
2 series · 2 of 2 positions shown · non-contrast
Comparison: May 06, 2020.

CLINICAL DATA: Status post right knee replacement.

EXAM:
PORTABLE RIGHT KNEE - 1-2 VIEW

[knee ap]
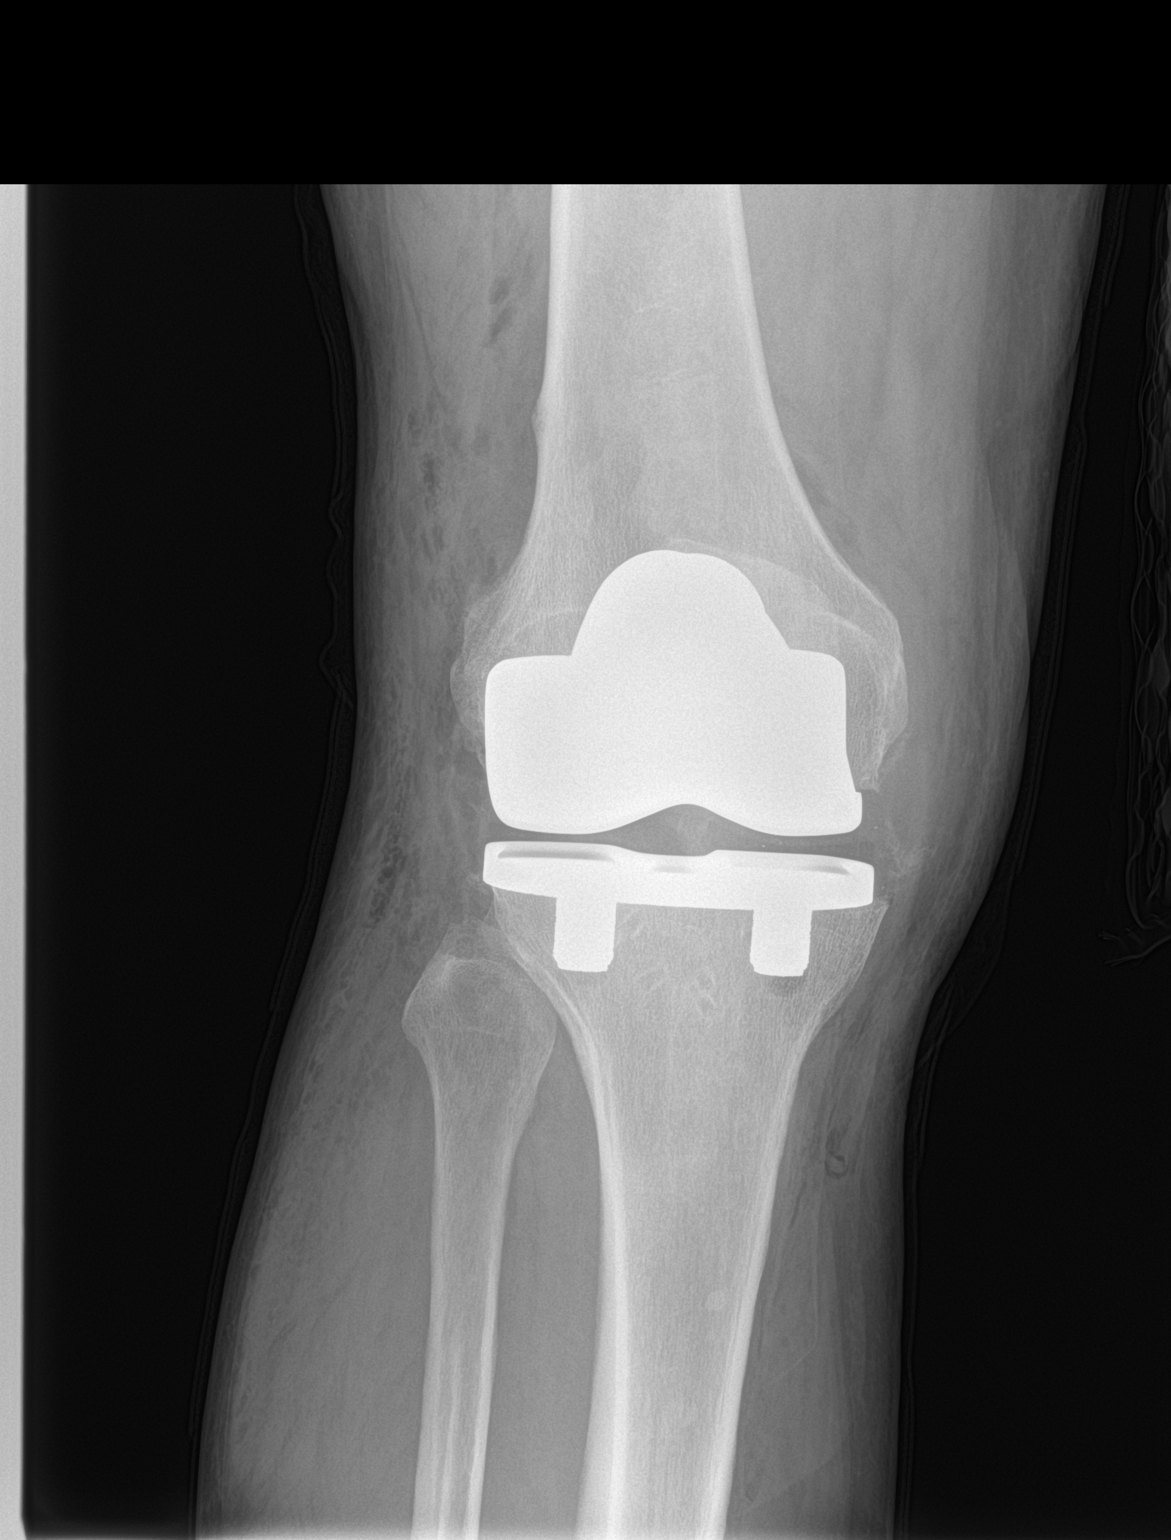

[knee lat]
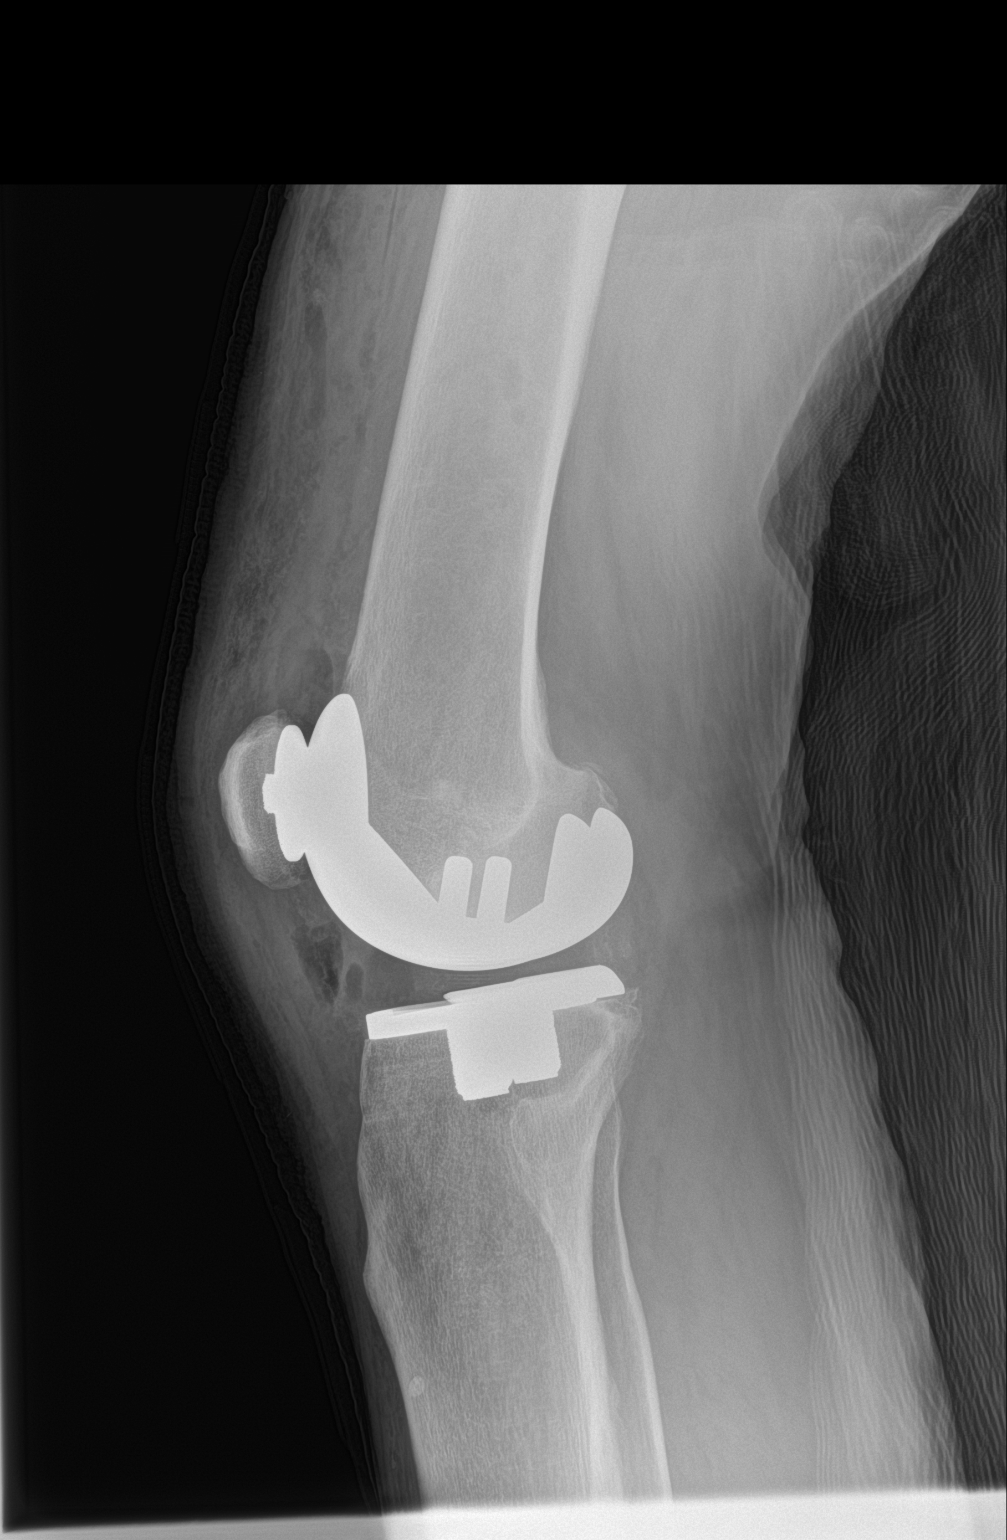

[2 of 2 positions shown; findings below may reference images not displayed]

FINDINGS: The right femoral and acetabular components appear to be well
situated. Expected postoperative changes are noted in the soft
tissues anteriorly and laterally.
IMPRESSION: Status post right total knee arthroplasty.

## 2023-12-04 ENCOUNTER — Ambulatory Visit (INDEPENDENT_AMBULATORY_CARE_PROVIDER_SITE_OTHER): Admitting: Surgical

## 2023-12-04 DIAGNOSIS — M25461 Effusion, right knee: Secondary | ICD-10-CM

## 2023-12-04 NOTE — Progress Notes (Signed)
 AE

## 2023-12-05 ENCOUNTER — Encounter (HOSPITAL_COMMUNITY): Payer: Self-pay | Admitting: Orthopedic Surgery

## 2023-12-05 ENCOUNTER — Other Ambulatory Visit: Payer: Self-pay

## 2023-12-05 LAB — SYNOVIAL FLUID ANALYSIS, COMPLETE
Basophils, %: 0 %
Eosinophils-Synovial: 0 % (ref 0–2)
Lymphocytes-Synovial Fld: 54 % (ref 0–74)
Monocyte/Macrophage: 14 % (ref 0–69)
Neutrophil, Synovial: 32 % — ABNORMAL HIGH (ref 0–24)
Synoviocytes, %: 0 % (ref 0–15)
WBC, Synovial: 394 {cells}/uL — ABNORMAL HIGH (ref <150)

## 2023-12-05 LAB — HOUSE ACCOUNT TRACKING

## 2023-12-05 LAB — TIQ-NTM

## 2023-12-05 NOTE — Progress Notes (Signed)
 SDW CALL  Patient was given pre-op instructions over the phone. The opportunity was given for the patient to ask questions. No further questions asked. Patient verbalized understanding of instructions given.   PCP - none Cardiologist - denies  PPM/ICD - denies Device Orders -  Rep Notified -   Chest x-ray - na EKG - na Stress Test -  ECHO - denies  Cardiac Cath - denies  Sleep Study -  Years ago, diagnosed with OSA then lost weight no longer uses CPAP  CPAP - no  Fasting Blood Sugar - na Checks Blood Sugar _____ times a day  Blood Thinner Instructions:na Aspirin  Instructions:na  ERAS Protcol -clears until1200 PRE-SURGERY Ensure or G2- no  COVID TEST- na   Anesthesia review: no  Patient denies shortness of breath, fever, cough and chest pain over the phone call    Special instructions:    Oral Hygiene is also important to reduce your risk of infection.  Remember - BRUSH YOUR TEETH THE MORNING OF SURGERY WITH YOUR REGULAR TOOTHPASTE

## 2023-12-06 ENCOUNTER — Ambulatory Visit (HOSPITAL_COMMUNITY): Admitting: Anesthesiology

## 2023-12-06 ENCOUNTER — Encounter: Payer: Self-pay | Admitting: Surgical

## 2023-12-06 ENCOUNTER — Other Ambulatory Visit (HOSPITAL_COMMUNITY): Payer: Self-pay

## 2023-12-06 ENCOUNTER — Ambulatory Visit (HOSPITAL_COMMUNITY)
Admission: RE | Admit: 2023-12-06 | Discharge: 2023-12-06 | Disposition: A | Discharge status: Home / Self Care | Attending: Orthopedic Surgery | Admitting: Orthopedic Surgery

## 2023-12-06 ENCOUNTER — Encounter (HOSPITAL_COMMUNITY)
Admission: RE | Disposition: A | Discharge status: Home / Self Care | Payer: Self-pay | Source: Home / Self Care | Attending: Orthopedic Surgery

## 2023-12-06 ENCOUNTER — Encounter (HOSPITAL_COMMUNITY): Payer: Self-pay | Admitting: Orthopedic Surgery

## 2023-12-06 ENCOUNTER — Other Ambulatory Visit: Payer: Self-pay

## 2023-12-06 DIAGNOSIS — L02415 Cutaneous abscess of right lower limb: Secondary | ICD-10-CM | POA: Diagnosis not present

## 2023-12-06 DIAGNOSIS — Z9884 Bariatric surgery status: Secondary | ICD-10-CM | POA: Insufficient documentation

## 2023-12-06 DIAGNOSIS — Z96651 Presence of right artificial knee joint: Secondary | ICD-10-CM | POA: Insufficient documentation

## 2023-12-06 DIAGNOSIS — G4730 Sleep apnea, unspecified: Secondary | ICD-10-CM | POA: Insufficient documentation

## 2023-12-06 DIAGNOSIS — L089 Local infection of the skin and subcutaneous tissue, unspecified: Secondary | ICD-10-CM | POA: Diagnosis not present

## 2023-12-06 DIAGNOSIS — L739 Follicular disorder, unspecified: Secondary | ICD-10-CM | POA: Diagnosis present

## 2023-12-06 DIAGNOSIS — Z01818 Encounter for other preprocedural examination: Secondary | ICD-10-CM

## 2023-12-06 HISTORY — PX: IRRIGATION AND DEBRIDEMENT KNEE: SHX5185

## 2023-12-06 LAB — BASIC METABOLIC PANEL WITH GFR
Anion gap: 10 (ref 5–15)
BUN: 6 mg/dL (ref 6–20)
CO2: 27 mmol/L (ref 22–32)
Calcium: 9.2 mg/dL (ref 8.9–10.3)
Chloride: 101 mmol/L (ref 98–111)
Creatinine, Ser: 1 mg/dL (ref 0.61–1.24)
GFR, Estimated: >60 mL/min (ref >60)
Glucose, Bld: 86 mg/dL (ref 70–99)
Potassium: 4.2 mmol/L (ref 3.5–5.1)
Sodium: 138 mmol/L (ref 135–145)

## 2023-12-06 LAB — CBC
HCT: 41.7 % (ref 39.0–52.0)
Hemoglobin: 13.8 g/dL (ref 13.0–17.0)
MCH: 29.9 pg (ref 26.0–34.0)
MCHC: 33.1 g/dL (ref 30.0–36.0)
MCV: 90.3 fL (ref 80.0–100.0)
Platelets: 307 10*3/uL (ref 150–400)
RBC: 4.62 MIL/uL (ref 4.22–5.81)
RDW: 13.3 % (ref 11.5–15.5)
WBC: 6.5 10*3/uL (ref 4.0–10.5)
nRBC: 0 % (ref 0.0–0.2)

## 2023-12-06 SURGERY — IRRIGATION AND DEBRIDEMENT KNEE
Anesthesia: General | Site: Knee | Laterality: Right

## 2023-12-06 MED ORDER — ORAL CARE MOUTH RINSE: 15.0000 mL | Freq: Once | OROMUCOSAL | Status: AC

## 2023-12-06 MED ORDER — CEFAZOLIN SODIUM-DEXTROSE 2-3 GM-%(50ML) IV SOLR
INTRAVENOUS | Status: DC | PRN
  Administered 2023-12-06: 2 g via INTRAVENOUS

## 2023-12-06 MED ORDER — POVIDONE-IODINE 10 % EX SWAB
2.0000 | Freq: Once | CUTANEOUS | Status: AC
  Administered 2023-12-06: 2 via TOPICAL

## 2023-12-06 MED ORDER — MORPHINE SULFATE (PF) 4 MG/ML IV SOLN
INTRAVENOUS | Status: DC | PRN
  Administered 2023-12-06: 33 mL via INTRAMUSCULAR

## 2023-12-06 MED ORDER — OXYCODONE HCL 5 MG PO TABS
5.0000 mg | ORAL_TABLET | ORAL | 0 refills | Status: DC | PRN
  Filled 2023-12-06: qty 30, 5d supply, fill #0

## 2023-12-06 MED ORDER — VANCOMYCIN HCL 1000 MG IV SOLR
INTRAVENOUS | Status: DC | PRN
  Administered 2023-12-06: 1000 mg via INTRAVENOUS

## 2023-12-06 MED ORDER — OXYCODONE HCL 5 MG/5ML PO SOLN: 5.0000 mg | Freq: Once | ORAL | Status: DC | PRN

## 2023-12-06 MED ORDER — CEFAZOLIN SODIUM-DEXTROSE 2-4 GM/100ML-% IV SOLN
INTRAVENOUS | Status: AC
  Filled 2023-12-06: qty 100

## 2023-12-06 MED ORDER — MORPHINE SULFATE (PF) 4 MG/ML IV SOLN
INTRAVENOUS | Status: AC
  Filled 2023-12-06: qty 2

## 2023-12-06 MED ORDER — AMISULPRIDE (ANTIEMETIC) 5 MG/2ML IV SOLN: 10.0000 mg | Freq: Once | INTRAVENOUS | Status: DC | PRN

## 2023-12-06 MED ORDER — LINEZOLID 600 MG PO TABS: 600.0000 mg | ORAL_TABLET | Freq: Two times a day (BID) | ORAL | 0 refills | Status: DC

## 2023-12-06 MED ORDER — ACETAMINOPHEN 500 MG PO TABS
1000.0000 mg | ORAL_TABLET | Freq: Once | ORAL | Status: AC
  Administered 2023-12-06: 1000 mg via ORAL
  Filled 2023-12-06: qty 2

## 2023-12-06 MED ORDER — LIDOCAINE 2% (20 MG/ML) 5 ML SYRINGE
INTRAMUSCULAR | Status: AC
  Filled 2023-12-06: qty 5

## 2023-12-06 MED ORDER — BUPIVACAINE HCL (PF) 0.25 % IJ SOLN
INTRAMUSCULAR | Status: AC
  Filled 2023-12-06: qty 30

## 2023-12-06 MED ORDER — VANCOMYCIN HCL 1000 MG IV SOLR
INTRAVENOUS | Status: AC
  Filled 2023-12-06: qty 20

## 2023-12-06 MED ORDER — LIDOCAINE 2% (20 MG/ML) 5 ML SYRINGE
INTRAMUSCULAR | Status: DC | PRN
  Administered 2023-12-06: 80 mg via INTRAVENOUS

## 2023-12-06 MED ORDER — GLYCOPYRROLATE PF 0.2 MG/ML IJ SOSY
PREFILLED_SYRINGE | INTRAMUSCULAR | Status: DC | PRN
  Administered 2023-12-06: .2 mg via INTRAVENOUS

## 2023-12-06 MED ORDER — PROPOFOL 10 MG/ML IV BOLUS
INTRAVENOUS | Status: DC | PRN
  Administered 2023-12-06: 200 mg via INTRAVENOUS

## 2023-12-06 MED ORDER — LINEZOLID 600 MG PO TABS
600.0000 mg | ORAL_TABLET | Freq: Two times a day (BID) | ORAL | 0 refills | Status: DC
  Filled 2023-12-06: qty 28, 14d supply, fill #0

## 2023-12-06 MED ORDER — OXYCODONE HCL 5 MG PO TABS: 5.0000 mg | ORAL_TABLET | Freq: Once | ORAL | Status: DC | PRN

## 2023-12-06 MED ORDER — DEXAMETHASONE SODIUM PHOSPHATE 10 MG/ML IJ SOLN
INTRAMUSCULAR | Status: DC | PRN
  Administered 2023-12-06: 10 mg via INTRAVENOUS

## 2023-12-06 MED ORDER — CHLORHEXIDINE GLUCONATE CLOTH 2 % EX PADS: 6.0000 | MEDICATED_PAD | Freq: Every day | CUTANEOUS | Status: DC

## 2023-12-06 MED ORDER — FENTANYL CITRATE (PF) 250 MCG/5ML IJ SOLN
INTRAMUSCULAR | Status: DC | PRN
  Administered 2023-12-06: 100 ug via INTRAVENOUS
  Administered 2023-12-06: 50 ug via INTRAVENOUS

## 2023-12-06 MED ORDER — MIDAZOLAM HCL 2 MG/2ML IJ SOLN
INTRAMUSCULAR | Status: AC
  Filled 2023-12-06: qty 2

## 2023-12-06 MED ORDER — SODIUM CHLORIDE 0.9% FLUSH: 10.0000 mL | INTRAVENOUS | Status: DC | PRN

## 2023-12-06 MED ORDER — HYDROMORPHONE HCL 1 MG/ML IJ SOLN: 0.2500 mg | INTRAMUSCULAR | Status: DC | PRN

## 2023-12-06 MED ORDER — SODIUM CHLORIDE 0.9 % IR SOLN
Status: DC | PRN
  Administered 2023-12-06: 1000 mL

## 2023-12-06 MED ORDER — MEPERIDINE HCL 25 MG/ML IJ SOLN: 6.2500 mg | INTRAMUSCULAR | Status: DC | PRN

## 2023-12-06 MED ORDER — FENTANYL CITRATE (PF) 250 MCG/5ML IJ SOLN
INTRAMUSCULAR | Status: AC
  Filled 2023-12-06: qty 5

## 2023-12-06 MED ORDER — ONDANSETRON HCL 4 MG/2ML IJ SOLN
INTRAMUSCULAR | Status: DC | PRN
  Administered 2023-12-06: 4 mg via INTRAVENOUS

## 2023-12-06 MED ORDER — PROPOFOL 500 MG/50ML IV EMUL
INTRAVENOUS | Status: DC | PRN
  Administered 2023-12-06: 150 ug/kg/min via INTRAVENOUS

## 2023-12-06 MED ORDER — ONDANSETRON HCL 4 MG/2ML IJ SOLN: 4.0000 mg | Freq: Once | INTRAMUSCULAR | Status: DC | PRN

## 2023-12-06 MED ORDER — KETOROLAC TROMETHAMINE 30 MG/ML IJ SOLN
30.0000 mg | Freq: Once | INTRAMUSCULAR | Status: AC | PRN
  Administered 2023-12-06: 30 mg via INTRAVENOUS

## 2023-12-06 MED ORDER — VANCOMYCIN HCL 1000 MG IV SOLR
INTRAVENOUS | Status: DC | PRN
  Administered 2023-12-06: 1000 mg via TOPICAL

## 2023-12-06 MED ORDER — SODIUM CHLORIDE 0.9% FLUSH: 10.0000 mL | Freq: Two times a day (BID) | INTRAVENOUS | Status: DC

## 2023-12-06 MED ORDER — CLONIDINE HCL (ANALGESIA) 100 MCG/ML EP SOLN
EPIDURAL | Status: AC
  Filled 2023-12-06: qty 10

## 2023-12-06 MED ORDER — LIDOCAINE HCL 1 % IJ SOLN
5.0000 mL | INTRAMUSCULAR | Status: AC | PRN
  Administered 2023-12-04: 5 mL

## 2023-12-06 MED ORDER — PROPOFOL 10 MG/ML IV BOLUS
INTRAVENOUS | Status: AC
Start: 2023-12-06 — End: 2023-12-06
  Filled 2023-12-06: qty 20

## 2023-12-06 MED ORDER — CELECOXIB 200 MG PO CAPS
200.0000 mg | ORAL_CAPSULE | Freq: Every day | ORAL | 1 refills | Status: DC
  Filled 2023-12-06: qty 30, 30d supply, fill #0

## 2023-12-06 MED ORDER — KETOROLAC TROMETHAMINE 30 MG/ML IJ SOLN
INTRAMUSCULAR | Status: AC
  Filled 2023-12-06: qty 1

## 2023-12-06 MED ORDER — POVIDONE-IODINE 7.5 % EX SOLN: Freq: Once | CUTANEOUS | Status: DC

## 2023-12-06 MED ORDER — VANCOMYCIN HCL IN DEXTROSE 1-5 GM/200ML-% IV SOLN
INTRAVENOUS | Status: DC
Start: 2023-12-06 — End: 2023-12-06
  Filled 2023-12-06: qty 200

## 2023-12-06 MED ORDER — CHLORHEXIDINE GLUCONATE 0.12 % MT SOLN
15.0000 mL | Freq: Once | OROMUCOSAL | Status: AC
  Administered 2023-12-06: 15 mL via OROMUCOSAL
  Filled 2023-12-06: qty 15

## 2023-12-06 MED ORDER — ONDANSETRON HCL 4 MG/2ML IJ SOLN
INTRAMUSCULAR | Status: AC
  Filled 2023-12-06: qty 2

## 2023-12-06 MED ORDER — ASPIRIN 81 MG PO CHEW
81.0000 mg | CHEWABLE_TABLET | Freq: Every day | ORAL | 0 refills | Status: DC
Start: 2023-12-06 — End: 2024-04-01
  Filled 2023-12-06: qty 14, 14d supply, fill #0

## 2023-12-06 MED ORDER — DEXMEDETOMIDINE HCL IN NACL 80 MCG/20ML IV SOLN
INTRAVENOUS | Status: AC
  Filled 2023-12-06: qty 20

## 2023-12-06 MED ORDER — LACTATED RINGERS IV SOLN: INTRAVENOUS | Status: DC

## 2023-12-06 MED ORDER — OXYCODONE HCL 5 MG PO TABS: 5.0000 mg | ORAL_TABLET | ORAL | 0 refills | Status: DC | PRN

## 2023-12-06 MED ORDER — MIDAZOLAM HCL 2 MG/2ML IJ SOLN
INTRAMUSCULAR | Status: DC | PRN
  Administered 2023-12-06: 2 mg via INTRAVENOUS

## 2023-12-06 SURGICAL SUPPLY — 45 items
ALCOHOL 70% 16 OZ (MISCELLANEOUS) ×1 IMPLANT
BAG COUNTER SPONGE SURGICOUNT (BAG) ×1 IMPLANT
BNDG COHESIVE 4X5 TAN STRL LF (GAUZE/BANDAGES/DRESSINGS) IMPLANT
BNDG ELASTIC 4X5.8 VLCR STR LF (GAUZE/BANDAGES/DRESSINGS) IMPLANT
BNDG ELASTIC 6INX 5YD STR LF (GAUZE/BANDAGES/DRESSINGS) IMPLANT
BNDG GAUZE DERMACEA FLUFF 4 (GAUZE/BANDAGES/DRESSINGS) IMPLANT
COVER SURGICAL LIGHT HANDLE (MISCELLANEOUS) ×1 IMPLANT
CUFF TOURN SGL QUICK 18X4 (TOURNIQUET CUFF) IMPLANT
CUFF TOURN SGL QUICK 42 (TOURNIQUET CUFF) IMPLANT
CUFF TRNQT CYL 24X4X16.5-23 (TOURNIQUET CUFF) IMPLANT
CUFF TRNQT CYL 34X4.125X (TOURNIQUET CUFF) IMPLANT
DRAPE U-SHAPE 47X51 STRL (DRAPES) ×1 IMPLANT
DURAPREP 26ML APPLICATOR (WOUND CARE) ×1 IMPLANT
ELECTRODE REM PT RTRN 9FT ADLT (ELECTROSURGICAL) ×1 IMPLANT
GAUZE PAD ABD 8X10 STRL (GAUZE/BANDAGES/DRESSINGS) IMPLANT
GAUZE SPONGE 4X4 12PLY STRL (GAUZE/BANDAGES/DRESSINGS) IMPLANT
GAUZE XEROFORM 5X9 LF (GAUZE/BANDAGES/DRESSINGS) IMPLANT
GLOVE BIOGEL PI IND STRL 7.0 (GLOVE) ×1 IMPLANT
GLOVE BIOGEL PI IND STRL 8 (GLOVE) ×1 IMPLANT
GLOVE ECLIPSE 7.0 STRL STRAW (GLOVE) ×1 IMPLANT
GLOVE ECLIPSE 8.0 STRL XLNG CF (GLOVE) ×1 IMPLANT
GOWN STRL REUS W/ TWL LRG LVL3 (GOWN DISPOSABLE) ×2 IMPLANT
GOWN STRL REUS W/ TWL XL LVL3 (GOWN DISPOSABLE) ×1 IMPLANT
KIT BASIN OR (CUSTOM PROCEDURE TRAY) ×1 IMPLANT
KIT TURNOVER KIT B (KITS) ×1 IMPLANT
MANIFOLD NEPTUNE II (INSTRUMENTS) ×1 IMPLANT
NS IRRIG 1000ML POUR BTL (IV SOLUTION) ×1 IMPLANT
PACK ORTHO EXTREMITY (CUSTOM PROCEDURE TRAY) ×1 IMPLANT
PAD ARMBOARD POSITIONER FOAM (MISCELLANEOUS) ×2 IMPLANT
PAD CAST 4YDX4 CTTN HI CHSV (CAST SUPPLIES) IMPLANT
SET HNDPC FAN SPRY TIP SCT (DISPOSABLE) IMPLANT
SPONGE T-LAP 18X18 ~~LOC~~+RFID (SPONGE) IMPLANT
SPONGE T-LAP 4X18 ~~LOC~~+RFID (SPONGE) IMPLANT
STOCKINETTE IMPERVIOUS 9X36 MD (GAUZE/BANDAGES/DRESSINGS) IMPLANT
SUT ETHILON 2 0 FS 18 (SUTURE) IMPLANT
SUT ETHILON 3 0 PS 1 (SUTURE) IMPLANT
SUT VIC AB 3-0 SH 27X BRD (SUTURE) IMPLANT
SWAB CULTURE ESWAB REG 1ML (MISCELLANEOUS) IMPLANT
SWAB CULTURE LIQ STUART DBL (MISCELLANEOUS) IMPLANT
TOWEL GREEN STERILE (TOWEL DISPOSABLE) ×1 IMPLANT
TOWEL GREEN STERILE FF (TOWEL DISPOSABLE) ×1 IMPLANT
TUBE CONNECTING 12X1/4 (SUCTIONS) ×1 IMPLANT
UNDERPAD 30X36 HEAVY ABSORB (UNDERPADS AND DIAPERS) ×1 IMPLANT
WATER STERILE IRR 1000ML POUR (IV SOLUTION) ×1 IMPLANT
YANKAUER SUCT BULB TIP NO VENT (SUCTIONS) ×1 IMPLANT

## 2023-12-06 NOTE — Progress Notes (Signed)
 Office Visit Note   Patient: Zachary Bates Zachary Bates           Date of Birth: 1975-11-16           MRN: [id number] Visit Date: 12/04/2023 Requested by: No referring provider defined for this encounter. PCP: Patient, No Pcp Per  Subjective: Chief Complaint  Patient presents with   Right Leg - Pain    HPI: Zachary Bates is a 48 y.o. male who presents to the office reporting right knee pain and wound.  Patient states that he has had right knee wound ongoing for about a week.  Started as what sounds like folliculitis that has progressed in size to what appears to be superficial abscess based on the pictures he provides on his smart phone.  It peaked in size on Saturday before his wife (a Publishing rights manager) started him on Bactrim.  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  He has been expressing drainage from it and it has been actively draining.  He did have fevers and chills subjectively up until he started Bactrim and since then he has not had any constitutional symptoms.  He has appetite that is intact and no other complaints.  He is ambulatory and able to bear weight on his right lower extremity.  He has history of right total knee arthroplasty by Zachary Bates in December 2021.  He did very well with this and has really had no issues with the knee up until recently.  He has no significant loss of range of motion of the knee over the last week.  The abscess is near the superior aspect of his total knee incision but not in line with the incision itself.  No other joint pains.              ROS: All systems reviewed are negative as they relate to the chief complaint within the history of present illness.  Patient denies fevers or chills.  Assessment & Plan: Visit Diagnoses:  1. Effusion of right knee     Plan: Impression is 48 year old male who has history of right total knee arthroplasty performed about 3.5 years ago.  He now presents with superficial abscess that extends down to the quad muscle/tendon  without extension into the joint by probing.  There is an effusion of the right knee which is suspicious for possible prosthetic joint infection with everything going on the last week.  Symptoms have been improving as he has been on Bactrim as prescribed by his nurse practitioner wife.  We discussed options available to patient and we will plan to take wound culture of the abscess and culture from knee aspiration.  CRP, ESR, CBC with differential were drawn today and sent off to lab.  Initial cell count from right knee aspiration demonstrates cell count of around 300 and neutrophil percentage of around 30 demonstrating no definitive periprosthetic joint infection.  We will plan to irrigate and debride the right knee abscess without entering into the right knee joint itself.  He will likely require several weeks of antibiotics and we will monitor his right knee over the next several weeks.  Likely will need to aspirate the knee in the future to assure that the cell count is continuing to trend down or at least not elevate in the future.  If cell count increases, may need to consider irrigation and debridement with polyethylene liner swap versus component removal.  At this time, this is not necessary.  Follow-up after procedure.  Follow-Up Instructions: No follow-ups on file.   Orders:  Orders Placed This Encounter  Procedures   Anaerobic and Aerobic Culture   Anaerobic and Aerobic Culture   Sed Rate (ESR)   CBC with Differential   C-reactive protein   Synovial Fluid Analysis, Complete   House Account Tracking only   TIQ-NTM   No orders of the defined types were placed in this encounter.     Procedures: Large Joint Inj: R knee on 12/04/2023 12:34 PM Indications: diagnostic evaluation, joint swelling and pain Details: 18 G 1.5 in needle, medial approach  Arthrogram: No  Medications: 5 mL lidocaine  1 % Aspirate: 25 mL blood-tinged and cloudy Outcome: tolerated well, no immediate  complications Procedure, treatment alternatives, risks and benefits explained, specific risks discussed. Consent was given by the patient. Immediately prior to procedure a time out was called to verify the correct patient, procedure, equipment, support staff and site/side marked as required. Patient was prepped and draped in the usual sterile fashion.       Clinical Data: No additional findings.  Objective: Vital Signs: There were no vitals taken for this visit.  Physical Exam:  Constitutional: Patient appears well-developed HEENT:  Head: Normocephalic Eyes:EOM are normal Neck: Normal range of motion Cardiovascular: Normal rate Pulmonary/chest: Effort normal Neurologic: Patient is alert Skin: Skin is warm Psychiatric: Patient has normal mood and affect  Ortho Exam: Ortho exam demonstrates right knee with positive effusion.  Incision from prior right total knee arthroplasty is well-healed with no sinus tract noted.  There is a golf ball sized area of erythema lateral to the incision with opening in the skin that is actively draining with expressible drainage.  This area was probed and the probe extends down to the quad muscle/tendon region but does not probe to bone or into the knee joint.  Right knee has intact quad strength rated 5/5.  No calf tenderness.  Negative Homans' sign.  Really has no pain with passive motion of the knee.  He has range of motion from 0 degrees extension to easily greater than 100 degrees of knee flexion.  He has positive effusion.  No erythema on the medial aspect of the knee.  Palpable DP pulse.  Specialty Comments:  No specialty comments available.  Imaging: No results found.   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  Past Surgical History:  Procedure Laterality Date    CYST EXCISION Left    Left 5th phalange   LAPAROSCOPIC GASTRIC BYPASS  2008   TOTAL KNEE ARTHROPLASTY Right 05/31/2020   Procedure: RIGHT TOTAL KNEE ARTHROPLASTY;  Surgeon: Jasmine Mesi, MD;  Location: [facility] OR;  Service: Orthopedics;  Laterality: Right;   Social History   Occupational History   Not on file  Tobacco Use   Smoking status: Never   Smokeless tobacco: Never  Vaping Use   Vaping status: Never Used  Substance and Sexual Activity   Alcohol use: Not Currently   Drug use: Never   Sexual activity: Yes

## 2023-12-06 NOTE — Anesthesia Preprocedure Evaluation (Addendum)
 Anesthesia Evaluation  Patient identified by MRN, date of birth, ID band Patient awake    Reviewed: Allergy & Precautions, NPO status , Patient's Chart, lab work & pertinent test results  Airway Mallampati: II  TM Distance: >3 FB Neck ROM: Full    Dental  (+) Dental Advisory Given, Teeth Intact   Pulmonary sleep apnea (prior to gastric bypass. since lost ~150lbs, not yet retested, not using cpap)    Pulmonary exam normal        Cardiovascular negative cardio ROS Normal cardiovascular exam     Neuro/Psych negative neurological ROS  negative psych ROS   GI/Hepatic Neg liver ROS,,, S/p gastric bypass   Endo/Other   Obesity   Renal/GU negative Renal ROS  negative genitourinary   Musculoskeletal negative musculoskeletal ROS (+)    Abdominal   Peds  Hematology negative hematology ROS (+)   Anesthesia Other Findings   Reproductive/Obstetrics negative OB ROS                             Anesthesia Physical Anesthesia Plan  ASA: 2  Anesthesia Plan: General   Post-op Pain Management: Tylenol  PO (pre-op)* and Toradol IV (intra-op)*   Induction: Intravenous  PONV Risk Score and Plan: 2 and Ondansetron , Dexamethasone, Midazolam  and Treatment may vary due to age or medical condition  Airway Management Planned: LMA  Additional Equipment: None  Intra-op Plan:   Post-operative Plan: Extubation in OR  Informed Consent: I have reviewed the patients History and Physical, chart, labs and discussed the procedure including the risks, benefits and alternatives for the proposed anesthesia with the patient or authorized representative who has indicated his/her understanding and acceptance.     Dental advisory given  Plan Discussed with: CRNA and Anesthesiologist  Anesthesia Plan Comments:        Anesthesia Quick Evaluation

## 2023-12-06 NOTE — Brief Op Note (Signed)
   12/06/2023  4:25 PM  PATIENT:  Zachary Bates  48 y.o. male  PRE-OPERATIVE DIAGNOSIS:  skin abcess  POST-OPERATIVE DIAGNOSIS:  skin abscess right knee  PROCEDURE:  Procedure(s): IRRIGATION AND excisional DEBRIDEMENT right knee skin abcess  SURGEON:  Surgeon(s): Jasmine Mesi, MD  ASSISTANT: none  ANESTHESIA:   general  EBL: 5 ml    Total I/O In: 850 [I.V.:600; IV Piggyback:250] Out: 5 [Blood:5]  BLOOD ADMINISTERED: none  DRAINS: none   LOCAL MEDICATIONS USED:  marcaine  morphine  clonidine   SPECIMEN:  cxs x 2 COUNTS:  YES  TOURNIQUET:  * No tourniquets in log *  DICTATION: .Other Dictation: Dictation Number [id number]  PLAN OF CARE: Discharge to home after PACU  PATIENT DISPOSITION:  PACU - hemodynamically stable

## 2023-12-06 NOTE — H&P (Signed)
 Zachary Bates [facility] is an 48 y.o. male.   Chief Complaint: Right knee folliculitis HPI: Zachary Bates is a 48 year old patient is now about 5 years out right total knee replacement.  Developed some folliculitis about 5 days ago.  Started on Bactrim 12 hours after onset of symptoms 4 cm lateral to the incision proximally on the right knee.  He denies any knee pain with range of motion or systemic fevers or chills.  He has had a knee aspiration which demonstrated 300 white cells with only about 30% polys.  Gram stain negative.  Culture has also been done on the open sore just in the proximal lateral aspect to the patella.  Past Medical History:  Diagnosis Date   Abscess 2009   Left 5th phalange     Past Surgical History:  Procedure Laterality Date   CYST EXCISION Left    Left 5th phalange   LAPAROSCOPIC GASTRIC BYPASS  2008   TOTAL KNEE ARTHROPLASTY Right 05/31/2020   Procedure: RIGHT TOTAL KNEE ARTHROPLASTY;  Surgeon: Jasmine Mesi, MD;  Location: [facility] OR;  Service: Orthopedics;  Laterality: Right;    History reviewed. No pertinent family history. Social History:  reports that he has never smoked. He has never used smokeless tobacco. He reports that he does not currently use alcohol. He reports that he does not use drugs.  Allergies:  Allergies  Allergen Reactions   Alpha-Gal     pork    Medications Prior to Admission  Medication Sig Dispense Refill   sulfamethoxazole-trimethoprim (BACTRIM DS) 800-160 MG tablet Take 1 tablet by mouth 2 (two) times daily.     celecoxib  (CELEBREX ) 200 MG capsule TAKE 1 CAPSULE BY MOUTH EVERY DAY AS NEEDED (Patient not taking: Reported on 12/06/2023) 30 capsule 1   [employer] ASPIRIN  ADULT LOW DOSE 81 MG chewable tablet CHEW 1 TABLET BY MOUTH EVERY DAY (Patient not taking: Reported on 12/06/2023) 30 tablet 0   Diclofenac  Sodium 1.5 % SOLN Place 150 mLs onto the skin every 4 (four) hours. Apply to affected area every 4-6 hours as needed (Patient not taking: Reported on  12/06/2023) 150 mL 1   methocarbamol  (ROBAXIN ) 500 MG tablet Take 1 tablet (500 mg total) by mouth every 8 (eight) hours as needed for muscle spasms. (Patient not taking: Reported on 12/04/2023) 30 tablet 0   oxyCODONE  (OXY IR/ROXICODONE ) 5 MG immediate release tablet Take 1 tablet (5 mg total) by mouth every 6 (six) hours as needed for moderate pain (pain score 4-6). (Patient not taking: Reported on 12/04/2023) 30 tablet 0    Results for orders placed or performed during the hospital encounter of 12/06/23 (from the past 48 hours)  CBC     Status: None   Collection Time: 12/06/23  1:00 PM  Result Value Ref Range   WBC 6.5 4.0 - 10.5 K/uL   RBC 4.62 4.22 - 5.81 MIL/uL   Hemoglobin 13.8 13.0 - 17.0 g/dL   HCT 16.1 09.6 - 04.5 %   MCV 90.3 80.0 - 100.0 fL   MCH 29.9 26.0 - 34.0 pg   MCHC 33.1 30.0 - 36.0 g/dL   RDW 40.9 81.1 - 91.4 %   Platelets 307 150 - 400 K/uL   nRBC 0.0 0.0 - 0.2 %    Comment: Performed at [facility], [address], [place], [place] [address]  Basic metabolic panel     Status: None   Collection Time: 12/06/23  1:00 PM  Result Value Ref Range  Sodium 138 135 - 145 mmol/L   Potassium 4.2 3.5 - 5.1 mmol/L   Chloride 101 98 - 111 mmol/L   CO2 27 22 - 32 mmol/L   Glucose, Bld 86 70 - 99 mg/dL    Comment: Glucose reference range applies only to samples taken after fasting for at least 8 hours.   BUN 6 6 - 20 mg/dL   Creatinine, Ser 0.45 0.61 - 1.24 mg/dL   Calcium 9.2 8.9 - 40.9 mg/dL   GFR, Estimated >81 >19 mL/min    Comment: (NOTE) Calculated using the CKD-EPI Creatinine Equation (2021)    Anion gap 10 5 - 15    Comment: Performed at [facility], [address] [place], [place] [address]   No results found.  Review of Systems  Musculoskeletal:  Positive for arthralgias.  All other systems reviewed and are negative.   Blood pressure 138/87, pulse 72, temperature 97.8 F (36.6 C), temperature source Oral, resp. rate 18, height 6' (1.829 m),  weight 110.2 kg, SpO2 99%. Physical Exam Vitals reviewed.  HENT:     Head: Normocephalic.     Nose: Nose normal.     Mouth/Throat:     Mouth: Mucous membranes are moist.   Eyes:     Pupils: Pupils are equal, round, and reactive to light.    Cardiovascular:     Rate and Rhythm: Normal rate.     Pulses: Normal pulses.  Pulmonary:     Effort: Pulmonary effort is normal.  Abdominal:     General: Abdomen is flat.   Musculoskeletal:     Cervical back: Normal range of motion.   Skin:    General: Skin is warm.     Capillary Refill: Capillary refill takes less than 2 seconds.   Neurological:     General: No focal deficit present.     Mental Status: He is alert.   Psychiatric:        Mood and Affect: Mood normal.   Right knee demonstrates no effusion with painless range of motion from about 0-1 05.  Extensor mechanism intact.  There is about a 2 cm radius rim of induration around a follicular type abscess measuring 5 x 6 mm.  No proximal lymphadenopathy but there is a little bit of swelling in the proximal calf distally.  Negative Homans.  Assessment/Plan Impression is right distal thigh folliculitis and draining abscess with some induration about 2 cm radius around this folliculitis area.  No effusion in the knee joint today.  The knee was aspirated through a medial approach 2 days ago.  White count and analysis negative for infection at this point.  Plan is excisional debridement of this wound.  Will likely use vancomycin  powder.  Loose closure.  We will need to follow the knee closely.  We will keep him on oral antibiotics and also give him some IV antibiotics after cultures are obtained today.  Risks and benefits of discussed.  All questions answered  Marykay Snipes, MD 12/06/2023, 3:12 PM

## 2023-12-06 NOTE — Transfer of Care (Signed)
 Immediate Anesthesia Transfer of Care Note  Patient: Zachary Bates  Procedure(s) Performed: IRRIGATION AND DEBRIDEMENT KNEE (Right: Knee)  Patient Location: PACU  Anesthesia Type:General  Level of Consciousness: awake, alert , and oriented  Airway & Oxygen Therapy: Patient Spontanous Breathing and Patient connected to face mask oxygen  Post-op Assessment: Report given to RN, Post -op Vital signs reviewed and stable, and Patient moving all extremities X 4  Post vital signs: Reviewed and stable  Last Vitals:  Vitals Value Taken Time  BP 108/73 12/06/23 16:19  Temp    Pulse 59 12/06/23 16:23  Resp 9 12/06/23 16:23  SpO2 92 % 12/06/23 16:23  Vitals shown include unfiled device data.  Last Pain:  Vitals:   12/06/23 1309  TempSrc:   PainSc: 0-No pain      Patients Stated Pain Goal: 0 (12/06/23 1301)  Complications: No notable events documented.

## 2023-12-06 NOTE — Anesthesia Postprocedure Evaluation (Signed)
 Anesthesia Post Note  Patient: Breeze Angell [facility]  Procedure(s) Performed: IRRIGATION AND DEBRIDEMENT KNEE (Right: Knee)     Patient location during evaluation: PACU Anesthesia Type: General Level of consciousness: awake and alert, oriented and patient cooperative Pain management: pain level controlled Vital Signs Assessment: post-procedure vital signs reviewed and stable Respiratory status: spontaneous breathing, nonlabored ventilation and respiratory function stable Cardiovascular status: blood pressure returned to baseline and stable Postop Assessment: no apparent nausea or vomiting Anesthetic complications: no  No notable events documented.  Last Vitals:  Vitals:   12/06/23 1700 12/06/23 1715  BP: 105/72 103/73  Pulse: (!) 48 (!) 41  Resp: 12 11  Temp:  36.6 C  SpO2: 96% 96%    Last Pain:  Vitals:   12/06/23 1715  TempSrc:   PainSc: 1                  Keyshun Elpers,E. Shariah Assad

## 2023-12-07 ENCOUNTER — Encounter (HOSPITAL_COMMUNITY): Payer: Self-pay | Admitting: Orthopedic Surgery

## 2023-12-07 DIAGNOSIS — L089 Local infection of the skin and subcutaneous tissue, unspecified: Secondary | ICD-10-CM

## 2023-12-09 ENCOUNTER — Ambulatory Visit: Payer: Self-pay | Admitting: Orthopedic Surgery

## 2023-12-09 NOTE — Op Note (Signed)
 NAME: Zachary Bates, Zachary W. MEDICAL RECORD NO: [id number] ACCOUNT NO: [id number] DATE OF BIRTH: 02-Mar-1976 FACILITY: [facility] LOCATION: [facility] PHYSICIAN: Cordella RAMAN. Addie, MD  Operative Report   DATE OF PROCEDURE: 12/06/2023  PREOPERATIVE DIAGNOSIS:  Right superficial skin abscess infection.  POSTOPERATIVE DIAGNOSIS:  Right superficial skin abscess infection.  PROCEDURE:  Right superficial skin abscess excisional debridement with placement of vancomycin  powder.  SURGEON:  Cordella Glendia Addie, MD  ASSISTANT:  None.  INDICATIONS:  The patient is a 48 year old who underwent total knee replacement about 3 years ago.  He presents now for operative management after explanation of the risks and benefits of a superficial skin infection, which developed proximal and lateral  to his total knee incision.  The knee was aspirated 2 days ago with only 300 white cells present, 30% neutrophils.  No growth to date on that.  Culture is still pending on the superficial abscess.  DESCRIPTION OF PROCEDURE:  The patient was brought to the operating room where no IV antibiotics were given until after cultures were obtained.  The right leg was prepped with ChloraPrep solution and draped in a sterile manner.  The timeout was called.   The patient had about a 5 mm in diameter area of an open wound with some granulation tissue at the base.  Rim of induration about  2.5 cm in each direction around this superficial infection.  There was no fluctuance within this region.  The knee itself  had trace effusion.  After calling timeout, an incision was made extending this circular infection proximally and distally about 2 cm.  Excisional debridement was then performed using curette, knife, and rongeur.  This did not go down below the subdermal  layer.  This went down to the fascia, but not through the fascia.  Debridement was performed.  The original eschar was excised.  Three liters of pouring irrigation was utilized after curettage  and excisional debridement.  At this time, vancomycin  powder  was placed.  The skin edges were anesthetized using Marcaine , morphine , and clonidine .  One suture was then placed which was a 2-0 far-near near-far nylon.  Bulky dressing applied.  The patient was transported to the recovery room in stable condition.   Two cultures were obtained.   SHW D: 12/06/2023 4:30:56 pm T: 12/07/2023 1:51:00 am  JOB: [id number]/ [id number]

## 2023-12-09 NOTE — Progress Notes (Signed)
MRSA

## 2023-12-10 LAB — ANAEROBIC AND AEROBIC CULTURE
AER RESULT:: NO GROWTH
MICRO NUMBER:: 16568087
MICRO NUMBER:: 16568088
MICRO NUMBER:: 16568553
MICRO NUMBER:: 16568554
SPECIMEN QUALITY:: ADEQUATE
SPECIMEN QUALITY:: ADEQUATE
SPECIMEN QUALITY:: ADEQUATE
SPECIMEN QUALITY:: ADEQUATE

## 2023-12-10 LAB — AEROBIC/ANAEROBIC CULTURE W GRAM STAIN (SURGICAL/DEEP WOUND)

## 2023-12-10 LAB — HOUSE ACCOUNT TRACKING

## 2023-12-11 ENCOUNTER — Encounter: Payer: Self-pay | Admitting: Surgical

## 2023-12-11 ENCOUNTER — Ambulatory Visit (INDEPENDENT_AMBULATORY_CARE_PROVIDER_SITE_OTHER): Admitting: Surgical

## 2023-12-11 DIAGNOSIS — S81001A Unspecified open wound, right knee, initial encounter: Secondary | ICD-10-CM

## 2023-12-11 LAB — AEROBIC/ANAEROBIC CULTURE W GRAM STAIN (SURGICAL/DEEP WOUND)

## 2023-12-11 NOTE — Progress Notes (Signed)
   Post-Op Visit Note   Patient: Zachary Bates           Date of Birth: 08/23/1975           MRN: [id number] Visit Date: 12/11/2023 PCP: Patient, No Pcp Per   Assessment & Plan:  Chief Complaint:  Chief Complaint  Patient presents with   Right Knee - Routine Post Op    12/06/2023 I&D right knee   Visit Diagnoses:  1. Open wound of right knee without complication, initial encounter     Plan: Patient is a 48 year old male who presents s/p right knee follicular abscess irrigation and debridement on 12/06/2023.  He is doing well.  Compliant with taking Zyvox twice daily.  No fevers or chills.  Really does not feel unwell in any way.  Has some mild pain around the right knee but it is overall well-controlled.  Changing bandage on occasion and is having some drainage as expected.  Using silver cell.  On exam, wound looks to be healing well with no surrounding erythema.  Suture in place and there is a small amount of expressible drainage.  No effusion in the knee.  There is no sinus tract around the total knee incision.  He has excellent range of motion from 0 degrees extension of the right knee to about 100 agrees of knee flexion.  No pain elicited with passive motion of the knee.  No calf tenderness.  Negative Homan sign.  Plan at this time is return in 1 week for clinical recheck and removal of suture.  Change dressing every day until then.  Monitor for signs of infection.  Continue antibiotics for about 3 weeks.  He wants to return to work at some point in the near future which should be doable based on his symptom level.  If he wants to go back on Monday, this should be fine but I would keep the wound covered at all times and continue his antibiotics.  Follow-up on Wednesday.  Follow-Up Instructions: No follow-ups on file.   Orders:  No orders of the defined types were placed in this encounter.  No orders of the defined types were placed in this encounter.   Imaging: No results  found.  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  Past Surgical History:  Procedure Laterality Date   CYST EXCISION Left    Left 5th phalange   IRRIGATION AND DEBRIDEMENT KNEE Right 12/06/2023   Procedure: IRRIGATION AND DEBRIDEMENT KNEE;  Surgeon: Jasmine Mesi, MD;  Location: [facility] OR;  Service: Orthopedics;  Laterality: Right;   LAPAROSCOPIC GASTRIC BYPASS  2008   TOTAL KNEE ARTHROPLASTY Right 05/31/2020   Procedure: RIGHT TOTAL KNEE ARTHROPLASTY;  Surgeon: Jasmine Mesi, MD;  Location: [facility] OR;  Service: Orthopedics;  Laterality: Right;   Social History   Occupational History   Not on file  Tobacco Use   Smoking status: Never   Smokeless tobacco: Never  Vaping Use   Vaping status: Never Used  Substance and Sexual Activity   Alcohol use: Not Currently   Drug use: Never   Sexual activity: Yes

## 2023-12-18 ENCOUNTER — Ambulatory Visit (INDEPENDENT_AMBULATORY_CARE_PROVIDER_SITE_OTHER): Admitting: Surgical

## 2023-12-18 ENCOUNTER — Encounter: Payer: Self-pay | Admitting: Surgical

## 2023-12-18 DIAGNOSIS — S81001A Unspecified open wound, right knee, initial encounter: Secondary | ICD-10-CM

## 2023-12-18 MED ORDER — METHOCARBAMOL 500 MG PO TABS: 500.0000 mg | ORAL_TABLET | Freq: Three times a day (TID) | ORAL | 1 refills | Status: DC | PRN

## 2023-12-18 NOTE — Progress Notes (Signed)
 Post-Op Visit Note   Patient: Zachary Bates [facility]           Date of Birth: [birth year]           MRN: [id number] Visit Date: 12/18/2023 PCP: Patient, No Pcp Per   Assessment & Plan:  Chief Complaint:  Chief Complaint  Patient presents with   Right Knee - Routine Post Op, Follow-up    12/06/2023 I&D right knee   Visit Diagnoses:  1. Open wound of right knee without complication, initial encounter     Plan: Patient is a 48 year old male who presents for evaluation of right knee following right knee folliculitis irrigation debridement on 12/06/2023.  Doing well without any fevers or chills.  Really has minimal discomfort.  Drainage has all but stopped at this point.  Continuing with Zyvox  600 mg and he has about 2 to 3 days left of this prescription.  Has had some cramping so would like to try muscle relaxer.  No knee pain or restriction of his knee range of motion.  On exam today, wound looks to be healing well with no cellulitis or induration.  Suture intact and the suture was removed today.  He will continue to use dry dressings as needed and plan to check him back in 1 week.  He has full range of motion of his right knee and excellent quad strength.  There is no calf tenderness and negative Homans' sign no suspicion for DVT.  We will see him back in a week.  He does have a trace effusion in his right knee today but we will avoid aspiration due to how recent his surgery was to avoid false positive.  If he continues to have effusion at his next appointment in a week or develops increased knee symptoms in the meantime, we will plan to aspirate the knee at that time.  Follow-up in 1 week.  Plan to probe the wound at that point to see how deep it is.  Follow-Up Instructions: No follow-ups on file.   Orders:  No orders of the defined types were placed in this encounter.  Meds ordered this encounter  Medications   methocarbamol  (ROBAXIN ) 500 MG tablet    Sig: Take 1 tablet (500 mg total) by  mouth every 8 (eight) hours as needed for muscle spasms.    Dispense:  30 tablet    Refill:  1    Imaging: No results found.  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  Past Surgical History:  Procedure Laterality Date   CYST EXCISION Left    Left 5th phalange   IRRIGATION AND DEBRIDEMENT KNEE Right 12/06/2023   Procedure: IRRIGATION AND DEBRIDEMENT KNEE;  Surgeon: Addie Cordella Hamilton, MD;  Location[facility] OR;  Service: Orthopedics;  Laterality: Right;   LAPAROSCOPIC GASTRIC BYPASS  2008   TOTAL KNEE ARTHROPLASTY Right 05/31/2020   Procedure: RIGHT TOTAL KNEE ARTHROPLASTY;  Surgeon: Addie Cordella Hamilton, MD;  Location: [facility] OR;  Service: Orthopedics;  Laterality: Right;   Social History   Occupational History   Not on file  Tobacco Use   Smoking status: Never   Smokeless tobacco: Never  Vaping Use   Vaping status: Never Used  Substance and Sexual Activity  Alcohol use: Not Currently   Drug use: Never   Sexual activity: Yes

## 2023-12-25 ENCOUNTER — Ambulatory Visit: Admitting: Surgical

## 2023-12-26 ENCOUNTER — Ambulatory Visit: Admitting: Orthopaedic Surgery

## 2023-12-26 ENCOUNTER — Encounter: Payer: Self-pay | Admitting: Orthopaedic Surgery

## 2023-12-26 DIAGNOSIS — S81001D Unspecified open wound, right knee, subsequent encounter: Secondary | ICD-10-CM

## 2023-12-26 MED ORDER — LINEZOLID 600 MG PO TABS: 600.0000 mg | ORAL_TABLET | Freq: Two times a day (BID) | ORAL | 0 refills | Status: DC

## 2023-12-26 NOTE — Addendum Note (Signed)
 Addended by: TRINDA DEANE HERO on: 12/26/2023 12:53 PM   Modules accepted: Orders

## 2023-12-26 NOTE — Progress Notes (Signed)
 My knee is better.  His right knee wound has no redness.  His former wife removed the scab over the wound yesterday.  He has no new trauma.  Wound looks good. There is no redness. ROM of the right knee is full.  Gait is good.  He has no fever.  Encounter Diagnosis  Name Primary?   Open wound of right knee without complication, subsequent encounter Yes   I will renew the antibiotic.  Return in two weeks.  Call if worse.  Call if any problem.  Precautions discussed.  Electronically Signed Lemond Stable, MD 7/3/202510:59 AM

## 2024-01-08 ENCOUNTER — Ambulatory Visit (INDEPENDENT_AMBULATORY_CARE_PROVIDER_SITE_OTHER): Admitting: Surgical

## 2024-01-08 ENCOUNTER — Encounter: Payer: Self-pay | Admitting: Surgical

## 2024-01-08 DIAGNOSIS — M25461 Effusion, right knee: Secondary | ICD-10-CM

## 2024-01-08 NOTE — Progress Notes (Signed)
 Post-Op Visit Note   Patient: Zachary Bates [facility]           Date of Birth: 1975/10/04           MRN: [id number] Visit Date: 01/08/2024 PCP: Patient, No Pcp Per   Assessment & Plan:  Chief Complaint:  Chief Complaint  Patient presents with   Right Knee - Open Wound    S/p I and D 12/06/23   Visit Diagnoses:  1. Effusion of right knee     Plan: Patient is a 48 year old male who presents s/p right knee irrigation and debridement on 12/06/2023 for MRSA follicular abscess.  He saw Dr. Brenna on 12/26/2023 who restarted antibiotics.  Using some occasional silver and mupirocin ointment.  Still has some antibiotics left and not sure exactly how much.  No fevers or chills.  No change in his appetite.  Is having some diarrhea.  Going to try some probiotics.  On exam, surgical site appears to be healing very well with no induration or fluctuance there is very little in the way of erythema.  Looks much improved compared with his last visit with me.  Picture attachments are in chart.  He does have continued effusion in the right knee with no pain with passive motion and no sinus tract noted along the incision from his total knee.  He has range of motion from 0 degrees extension to greater than 90 degrees knee flexion without pain.  As we have discussed in the past, plan for right knee aspiration.  Right knee was prepped on the medial side of the knee away from the abscess site with alcohol, Betadine , ChloraPrep.  Sterile gloves were utilized and right knee was aspirated of about 25 cc of nonpurulent synovial fluid.  This will be sent off for fluid analysis to see the trend compared with his last aspiration.  Does not appear to be any true infection but need to make sure based on the close proximity of this MRSA abscess.  He will reach out with his antibiotics end date based on how much he has left and we should see him back about 2 weeks after he finishes antibiotics to see how the I&D site appears off  antibiotics.    Follow-Up Instructions: No follow-ups on file.   Orders:  Orders Placed This Encounter  Procedures   Wound culture   Synovial Fluid Analysis, Complete   No orders of the defined types were placed in this encounter.   Imaging: No results found.  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 No pertinent family history.  Past Surgical History:  Procedure Laterality Date   CYST EXCISION Left    Left 5th phalange   IRRIGATION AND DEBRIDEMENT KNEE Right 12/06/2023   Procedure: IRRIGATION AND DEBRIDEMENT KNEE;  Surgeon: Addie Cordella Hamilton, MD;  Location: [facility] OR;  Service: Orthopedics;  Laterality: Right;   LAPAROSCOPIC GASTRIC BYPASS  2008   TOTAL KNEE ARTHROPLASTY Right 05/31/2020   Procedure: RIGHT TOTAL KNEE ARTHROPLASTY;  Surgeon: Addie Cordella Hamilton, MD;  Location: [facility] OR;  Service: Orthopedics;  Laterality: Right;   Social History   Occupational History   Not on file  Tobacco Use   Smoking status: Never  Smokeless tobacco: Never  Vaping Use   Vaping status: Never Used  Substance and Sexual Activity   Alcohol use: Not Currently   Drug use: Never   Sexual activity: Yes

## 2024-01-09 LAB — SYNOVIAL FLUID ANALYSIS, COMPLETE
Basophils, %: 0 %
Eosinophils-Synovial: 0 % (ref 0–2)
Lymphocytes-Synovial Fld: 43 % (ref 0–74)
Monocyte/Macrophage: 24 % (ref 0–69)
Neutrophil, Synovial: 33 % — ABNORMAL HIGH (ref 0–24)
Synoviocytes, %: 0 % (ref 0–15)
WBC, Synovial: 559 {cells}/uL — ABNORMAL HIGH (ref <150)

## 2024-01-09 LAB — HOUSE ACCOUNT TRACKING

## 2024-01-13 LAB — GRAM STAIN: Organism ID, Bacteria: NONE SEEN

## 2024-01-13 LAB — BODY FLUID CULTURE

## 2024-01-13 LAB — SPECIMEN STATUS REPORT

## 2024-01-15 ENCOUNTER — Ambulatory Visit: Payer: Self-pay | Admitting: Surgical

## 2024-01-15 DIAGNOSIS — L989 Disorder of the skin and subcutaneous tissue, unspecified: Secondary | ICD-10-CM

## 2024-01-22 ENCOUNTER — Ambulatory Visit (INDEPENDENT_AMBULATORY_CARE_PROVIDER_SITE_OTHER): Admitting: Surgical

## 2024-01-22 ENCOUNTER — Encounter: Payer: Self-pay | Admitting: Surgical

## 2024-01-22 DIAGNOSIS — M25461 Effusion, right knee: Secondary | ICD-10-CM

## 2024-01-22 MED ORDER — AMOXICILLIN 500 MG PO TABS: 2000.0000 mg | ORAL_TABLET | Freq: Once | ORAL | 0 refills | Status: AC

## 2024-01-22 NOTE — Progress Notes (Signed)
 Post-Op Visit Note   Patient: Zachary Bates [facility]           Date of Birth: Sep 03, 1975           MRN: [id number] Visit Date: 01/22/2024 PCP: Patient, No Pcp Per   Assessment & Plan:  Chief Complaint:  Chief Complaint  Patient presents with   Right Knee - Post-op Follow-up    12/06/2023 I&D right knee   Visit Diagnoses:  1. Effusion of right knee      Plan: Patient is a 48 year old male who presents s/p right knee irrigation and debridement on 12/06/2023 for MRSA follicular abscess.  He has been off antibiotics for about 2 weeks to weeks at this point.  No constitutional symptoms such as fevers or chills.  He really has no discomfort or any symptoms regarding his right knee.  Does have some discomfort in the third MCP joint of his left hand that has been ongoing for several months.  He has some swelling in this region at times.  No triggering of the finger.  Most of his pain is on the dorsum of this joint.  Not bother him enough for any intervention and he would just wants to try some topical Voltaren  to see if this will help.  On exam, surgical site appears to be healing very well with no induration or fluctuance.  There is no erythema.  Only trace effusion noted in the right knee.  There is no pain with passive motion of the right knee.  Able to perform straight leg raise.  No warmth noted over the right knee.  Ambulates without antalgia.  Plan at this time is follow-up as needed regarding his right knee wound.  If symptoms return such as fevers, chills, new wound, patient understands best option is to return to the clinic for further evaluation.  He does need dental work in the next few months so plan to prescribe preprocedure antibiotics.  There is no need for any further intervention regarding his right knee.  He has had 2 aspirations of the knee at this point demonstrating no significant elevation elation of cell count or neutrophil percentage and cultures as well as Gram stain have been  negative.   Follow-Up Instructions: No follow-ups on file.   Orders:  No orders of the defined types were placed in this encounter.  No orders of the defined types were placed in this encounter.   Imaging: No results found.  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  Past Surgical History:  Procedure Laterality Date   CYST EXCISION Left    Left 5th phalange   IRRIGATION AND DEBRIDEMENT KNEE Right 12/06/2023   Procedure: IRRIGATION AND DEBRIDEMENT KNEE;  Surgeon: Addie Cordella Hamilton, MD;  Location: [facility] OR;  Service: Orthopedics;  Laterality: Right;   LAPAROSCOPIC GASTRIC BYPASS  2008   TOTAL KNEE ARTHROPLASTY Right 05/31/2020   Procedure: RIGHT TOTAL KNEE ARTHROPLASTY;  Surgeon: Addie Cordella Hamilton, MD;  Location: [facility] OR;  Service: Orthopedics;  Laterality: Right;   Social History   Occupational History   Not on file  Tobacco Use   Smoking status: Never   Smokeless tobacco: Never  Vaping Use   Vaping  status: Never Used  Substance and Sexual Activity   Alcohol use: Not Currently   Drug use: Never   Sexual activity: Yes

## 2024-01-30 ENCOUNTER — Telehealth: Payer: Self-pay | Admitting: Surgical

## 2024-01-30 NOTE — Telephone Encounter (Signed)
 Pt wife Delon called requesting a referral for infectious disease. Lesions popping on knee. Please call pt's wife bout this matter at [phone].

## 2024-01-30 NOTE — Telephone Encounter (Signed)
 Talked with patients wife. Stated that he has 5 spots on his hand, not his knee. She will have him send pictures through mychart.  No fever/chills Is having drainage. Wanting referral ID

## 2024-01-31 NOTE — Telephone Encounter (Signed)
 Referral entered

## 2024-01-31 NOTE — Telephone Encounter (Signed)
 Sending a message to Zachary Bates and his wife about multiple spots on his hand.  If you want to change the plan, just let me know

## 2024-01-31 NOTE — Telephone Encounter (Signed)
 Can we put referral in for infectious disease consultation?

## 2024-02-05 ENCOUNTER — Encounter: Admitting: Surgical

## 2024-02-14 ENCOUNTER — Encounter: Payer: Self-pay | Admitting: Radiology

## 2024-02-21 ENCOUNTER — Telehealth: Payer: Self-pay | Admitting: Surgical

## 2024-02-21 MED ORDER — DOXYCYCLINE HYCLATE 100 MG PO CAPS: 100.0000 mg | ORAL_CAPSULE | Freq: Two times a day (BID) | ORAL | 0 refills | Status: DC

## 2024-02-21 NOTE — Telephone Encounter (Signed)
 I sent pt portal message to Dr Addie to review picture

## 2024-02-21 NOTE — Telephone Encounter (Signed)
 Pls put him on doxy 100  bid for 10 d and come in next week thx

## 2024-02-21 NOTE — Telephone Encounter (Signed)
**Note De-identified  [place] Obfuscation** Please advise 

## 2024-02-21 NOTE — Telephone Encounter (Signed)
 Pt called stating he spoke with Herlene this AM and was advised to upload pic of what his issue is and to call and ask for Lauren after piucs are uploaded. Lauren please call pt at [phone].

## 2024-02-25 ENCOUNTER — Encounter: Payer: Self-pay | Admitting: Infectious Disease

## 2024-02-25 DIAGNOSIS — M71061 Abscess of bursa, right knee: Secondary | ICD-10-CM | POA: Insufficient documentation

## 2024-02-25 NOTE — Progress Notes (Unsigned)
   Reason for Infectious Disease Consult: skin lesions  Requesting Physician: Carlin Calix, PA-C  Subjective:    Patient ID: Zachary Bates, male    DOB: March 16, 1976, 48 y.o.   MRN: [id number]  HPI  Past Medical History:  Diagnosis Date   Abscess 2009   Left 5th phalange     Past Surgical History:  Procedure Laterality Date   CYST EXCISION Left    Left 5th phalange   IRRIGATION AND DEBRIDEMENT KNEE Right 12/06/2023   Procedure: IRRIGATION AND DEBRIDEMENT KNEE;  Surgeon: Addie Cordella Hamilton, MD;  Location: [facility] OR;  Service: Orthopedics;  Laterality: Right;   LAPAROSCOPIC GASTRIC BYPASS  2008   TOTAL KNEE ARTHROPLASTY Right 05/31/2020   Procedure: RIGHT TOTAL KNEE ARTHROPLASTY;  Surgeon: Addie Cordella Hamilton, MD;  Location: [facility] OR;  Service: Orthopedics;  Laterality: Right;    No family history on file.    Social History   Socioeconomic History   Marital status: Married    Spouse name: Not on file   Number of children: Not on file   Years of education: Not on file   Highest education level: Not on file  Occupational History   Not on file  Tobacco Use   Smoking status: Never   Smokeless tobacco: Never  Vaping Use   Vaping status: Never Used  Substance and Sexual Activity   Alcohol use: Not Currently   Drug use: Never   Sexual activity: Yes  Other Topics Concern   Not on file  Social History Narrative   Not on file   Social Drivers of Health   Financial Resource Strain: Not on file  Food Insecurity: Not on file  Transportation Needs: Not on file  Physical Activity: Not on file  Stress: Not on file  Social Connections: Not on file    Allergies  Allergen Reactions   Alpha-Gal     pork     Current Outpatient Medications:    aspirin  ([employer] ASPIRIN  ADULT LOW DOSE) 81 MG chewable tablet, Chew 1 tablet (81 mg total) by mouth daily., Disp: 14 tablet, Rfl: 0   celecoxib  (CELEBREX ) 200 MG capsule, Take 1 capsule (200 mg total) by mouth daily. (Patient not taking:  Reported on 12/26/2023), Disp: 30 capsule, Rfl: 1   doxycycline  (VIBRAMYCIN ) 100 MG capsule, Take 1 capsule (100 mg total) by mouth 2 (two) times daily., Disp: 20 capsule, Rfl: 0   linezolid  (ZYVOX ) 600 MG tablet, Take 1 tablet (600 mg total) by mouth 2 (two) times daily., Disp: 28 tablet, Rfl: 0   methocarbamol  (ROBAXIN ) 500 MG tablet, Take 1 tablet (500 mg total) by mouth every 8 (eight) hours as needed for muscle spasms., Disp: 30 tablet, Rfl: 1   oxyCODONE  (ROXICODONE ) 5 MG immediate release tablet, Take 1 tablet (5 mg total) by mouth every 4 (four) hours as needed for severe pain (pain score 7-10). (Patient not taking: Reported on 12/26/2023), Disp: 30 tablet, Rfl: 0    Review of Systems     Objective:   Physical Exam        Assessment & Plan:

## 2024-02-26 ENCOUNTER — Telehealth: Payer: Self-pay

## 2024-02-26 ENCOUNTER — Other Ambulatory Visit: Payer: Self-pay

## 2024-02-26 ENCOUNTER — Ambulatory Visit (INDEPENDENT_AMBULATORY_CARE_PROVIDER_SITE_OTHER): Admitting: Infectious Disease

## 2024-02-26 VITALS — BP 136/77 | HR 77 | Temp 98.6°F | Resp 16 | Wt 242.6 lb

## 2024-02-26 DIAGNOSIS — M71061 Abscess of bursa, right knee: Secondary | ICD-10-CM | POA: Diagnosis not present

## 2024-02-26 DIAGNOSIS — Z1159 Encounter for screening for other viral diseases: Secondary | ICD-10-CM

## 2024-02-26 DIAGNOSIS — Z96651 Presence of right artificial knee joint: Secondary | ICD-10-CM

## 2024-02-26 DIAGNOSIS — T8459XA Infection and inflammatory reaction due to other internal joint prosthesis, initial encounter: Secondary | ICD-10-CM

## 2024-02-26 DIAGNOSIS — L089 Local infection of the skin and subcutaneous tissue, unspecified: Secondary | ICD-10-CM

## 2024-02-26 DIAGNOSIS — Z114 Encounter for screening for human immunodeficiency virus [HIV]: Secondary | ICD-10-CM

## 2024-02-26 DIAGNOSIS — L247 Irritant contact dermatitis due to plants, except food: Secondary | ICD-10-CM

## 2024-02-26 DIAGNOSIS — Z22322 Carrier or suspected carrier of Methicillin resistant Staphylococcus aureus: Secondary | ICD-10-CM

## 2024-02-26 NOTE — Telephone Encounter (Signed)
 Mychart message sent to patient about 65mo appt per Dr Addie

## 2024-02-27 ENCOUNTER — Telehealth: Payer: Self-pay | Admitting: Infectious Disease

## 2024-02-27 LAB — COMPLETE METABOLIC PANEL WITHOUT GFR
AG Ratio: 1.8 (calc) (ref 1.0–2.5)
ALT: 22 U/L (ref 9–46)
AST: 23 U/L (ref 10–40)
Albumin: 4.4 g/dL (ref 3.6–5.1)
Alkaline phosphatase (APISO): 101 U/L (ref 36–130)
BUN: 9 mg/dL (ref 7–25)
CO2: 28 mmol/L (ref 20–32)
Calcium: 9.4 mg/dL (ref 8.6–10.3)
Chloride: 104 mmol/L (ref 98–110)
Creat: 0.98 mg/dL (ref 0.60–1.29)
Globulin: 2.5 g/dL (ref 1.9–3.7)
Glucose, Bld: 90 mg/dL (ref 65–99)
Potassium: 4.7 mmol/L (ref 3.5–5.3)
Sodium: 140 mmol/L (ref 135–146)
Total Bilirubin: 0.5 mg/dL (ref 0.2–1.2)
Total Protein: 6.9 g/dL (ref 6.1–8.1)

## 2024-02-27 LAB — HEPATITIS A ANTIBODY, TOTAL: Hepatitis A AB,Total: NONREACTIVE

## 2024-02-27 LAB — CBC WITH DIFFERENTIAL/PLATELET
Absolute Lymphocytes: 1662 {cells}/uL (ref 850–3900)
Absolute Monocytes: 492 {cells}/uL (ref 200–950)
Basophils Absolute: 42 {cells}/uL (ref 0–200)
Basophils Relative: 0.7 %
Eosinophils Absolute: 162 {cells}/uL (ref 15–500)
Eosinophils Relative: 2.7 %
HCT: 41.3 % (ref 38.5–50.0)
Hemoglobin: 13.7 g/dL (ref 13.2–17.1)
MCH: 29.8 pg (ref 27.0–33.0)
MCHC: 33.2 g/dL (ref 32.0–36.0)
MCV: 89.8 fL (ref 80.0–100.0)
MPV: 12.1 fL (ref 7.5–12.5)
Monocytes Relative: 8.2 %
Neutro Abs: 3642 {cells}/uL (ref 1500–7800)
Neutrophils Relative %: 60.7 %
Platelets: 290 Thousand/uL (ref 140–400)
RBC: 4.6 Million/uL (ref 4.20–5.80)
RDW: 13.7 % (ref 11.0–15.0)
Total Lymphocyte: 27.7 %
WBC: 6 Thousand/uL (ref 3.8–10.8)

## 2024-02-27 LAB — SEDIMENTATION RATE: Sed Rate: 6 mm/h (ref 0–15)

## 2024-02-27 LAB — C-REACTIVE PROTEIN: CRP: <3 mg/L (ref <8.0)

## 2024-02-27 LAB — HIV ANTIBODY (ROUTINE TESTING W REFLEX): HIV 1&2 Ab, 4th Generation: NONREACTIVE

## 2024-02-27 NOTE — Telephone Encounter (Signed)
 Appt made. Patient will bring in MRI CD/Report to visit.

## 2024-02-27 NOTE — Telephone Encounter (Signed)
 thx

## 2024-02-27 NOTE — Telephone Encounter (Signed)
 I called patient today after my secure chat with Dr. Addie  He is going to stop his antibiotics today  Key thing will be for him to STAY off antibiotics so Dr. Addie can re-aspirate his joint again in sterile manner for cell count and differential and culture, when we have had ZERO antibiotics in Zachary Bates's system for 3, preferably 4 weeks  His wife was organizing MRI near [place] which will give us  more data bu the joint aspiration is the key diagnostic maneuver  I agree with Dr. Addie that clinically the patient is not behaving as if he has a PJI and FWIW ESR, CRP are normal  It may be that the joint aspirate from June 11th, was a contaminated sample but we want to make sure we have better data to support that idea

## 2024-03-09 ENCOUNTER — Telehealth: Payer: Self-pay

## 2024-03-09 NOTE — Telephone Encounter (Signed)
 IC advised per Dr Addie needs to hold off on any dental procedure until he sees patient in follow up.

## 2024-03-09 NOTE — Telephone Encounter (Signed)
 Would hold til rov with me thx

## 2024-03-09 NOTE — Telephone Encounter (Signed)
 Zachary Bates with [facility] in [place] would like pre-medication dental letter/note faxed to [phone].  Would like to know if patient is okay to have dental work done due to problems with his knee now?  Cb# [phone].  Please advise.  Thank you

## 2024-03-14 NOTE — Addendum Note (Signed)
 Addended by: FLEETA KATHIE JOMARIE LOISE on: 03/14/2024 12:28 PM   Modules accepted: Level of Service

## 2024-03-18 ENCOUNTER — Other Ambulatory Visit: Payer: Self-pay | Admitting: Infectious Disease

## 2024-03-18 ENCOUNTER — Encounter: Payer: Self-pay | Admitting: Infectious Disease

## 2024-03-18 DIAGNOSIS — M00061 Staphylococcal arthritis, right knee: Secondary | ICD-10-CM

## 2024-03-30 ENCOUNTER — Telehealth: Payer: Self-pay | Admitting: Infectious Disease

## 2024-03-30 NOTE — Telephone Encounter (Signed)
 Thanks.  Agree with aspirating the joint.  And sending for analysis.

## 2024-03-30 NOTE — Telephone Encounter (Signed)
 We were able to get an MRI of knee organized in [place]  though help of the patient's wife.  Below is the read showing some fluid in the joint but with much artefact       Joint aspiration fro cell count and differential, crystals and cultures off of antibiotics is planned   Cc Dr. Addie

## 2024-03-31 NOTE — Progress Notes (Unsigned)
   Subjective:  Chief Complaint; followup for possible prosthetic joint infection   Patient ID: Zachary Bates, male    DOB: 11/23/1975, 48 y.o.   MRN: [id number]  HPI  Past Medical History:  Diagnosis Date   Abscess 2009   Left 5th phalange    Abscess of bursa of right knee 02/25/2024    Past Surgical History:  Procedure Laterality Date   CYST EXCISION Left    Left 5th phalange   IRRIGATION AND DEBRIDEMENT KNEE Right 12/06/2023   Procedure: IRRIGATION AND DEBRIDEMENT KNEE;  Surgeon: Addie Cordella Hamilton, MD;  Location: [facility] OR;  Service: Orthopedics;  Laterality: Right;   LAPAROSCOPIC GASTRIC BYPASS  2008   TOTAL KNEE ARTHROPLASTY Right 05/31/2020   Procedure: RIGHT TOTAL KNEE ARTHROPLASTY;  Surgeon: Addie Cordella Hamilton, MD;  Location: [facility] OR;  Service: Orthopedics;  Laterality: Right;    No family history on file.    Social History   Socioeconomic History   Marital status: Married    Spouse name: Not on file   Number of children: Not on file   Years of education: Not on file   Highest education level: Not on file  Occupational History   Not on file  Tobacco Use   Smoking status: Never   Smokeless tobacco: Never  Vaping Use   Vaping status: Never Used  Substance and Sexual Activity   Alcohol use: Not Currently   Drug use: Never   Sexual activity: Yes  Other Topics Concern   Not on file  Social History Narrative   Not on file   Social Drivers of Health   Financial Resource Strain: Not on file  Food Insecurity: Not on file  Transportation Needs: Not on file  Physical Activity: Not on file  Stress: Not on file  Social Connections: Not on file    Allergies  Allergen Reactions   Alpha-Gal     pork     Current Outpatient Medications:    amoxicillin  (AMOXIL ) 500 MG tablet, SMARTSIG:4 Tablet(s) By Mouth (Patient not taking: Reported on 02/26/2024), Disp: , Rfl:    aspirin  ([employer] ASPIRIN  ADULT LOW DOSE) 81 MG chewable tablet, Chew 1 tablet (81 mg total) by mouth  daily. (Patient not taking: Reported on 02/26/2024), Disp: 14 tablet, Rfl: 0   celecoxib  (CELEBREX ) 200 MG capsule, Take 1 capsule (200 mg total) by mouth daily. (Patient not taking: Reported on 12/26/2023), Disp: 30 capsule, Rfl: 1   doxycycline  (VIBRAMYCIN ) 100 MG capsule, Take 1 capsule (100 mg total) by mouth 2 (two) times daily. (Patient not taking: Reported on 02/26/2024), Disp: 20 capsule, Rfl: 0   linezolid  (ZYVOX ) 600 MG tablet, Take 1 tablet (600 mg total) by mouth 2 (two) times daily., Disp: 28 tablet, Rfl: 0   methocarbamol  (ROBAXIN ) 500 MG tablet, Take 1 tablet (500 mg total) by mouth every 8 (eight) hours as needed for muscle spasms. (Patient not taking: Reported on 02/26/2024), Disp: 30 tablet, Rfl: 1   oxyCODONE  (ROXICODONE ) 5 MG immediate release tablet, Take 1 tablet (5 mg total) by mouth every 4 (four) hours as needed for severe pain (pain score 7-10). (Patient not taking: Reported on 02/26/2024), Disp: 30 tablet, Rfl: 0   Review of Systems     Objective:   Physical Exam        Assessment & Plan:

## 2024-04-01 ENCOUNTER — Encounter: Payer: Self-pay | Admitting: Infectious Disease

## 2024-04-01 ENCOUNTER — Ambulatory Visit (INDEPENDENT_AMBULATORY_CARE_PROVIDER_SITE_OTHER): Admitting: Orthopedic Surgery

## 2024-04-01 ENCOUNTER — Other Ambulatory Visit: Payer: Self-pay

## 2024-04-01 ENCOUNTER — Encounter: Payer: Self-pay | Admitting: Orthopedic Surgery

## 2024-04-01 ENCOUNTER — Ambulatory Visit (INDEPENDENT_AMBULATORY_CARE_PROVIDER_SITE_OTHER): Payer: Self-pay | Admitting: Infectious Disease

## 2024-04-01 VITALS — BP 125/82 | HR 75 | Temp 98.5°F | Ht 72.0 in | Wt 242.0 lb

## 2024-04-01 DIAGNOSIS — M25461 Effusion, right knee: Secondary | ICD-10-CM | POA: Diagnosis not present

## 2024-04-01 DIAGNOSIS — Z96651 Presence of right artificial knee joint: Secondary | ICD-10-CM

## 2024-04-01 DIAGNOSIS — Z22322 Carrier or suspected carrier of Methicillin resistant Staphylococcus aureus: Secondary | ICD-10-CM

## 2024-04-01 DIAGNOSIS — L089 Local infection of the skin and subcutaneous tissue, unspecified: Secondary | ICD-10-CM

## 2024-04-01 DIAGNOSIS — M71061 Abscess of bursa, right knee: Secondary | ICD-10-CM

## 2024-04-01 MED ORDER — LIDOCAINE HCL 1 % IJ SOLN
5.0000 mL | INTRAMUSCULAR | Status: AC | PRN
  Administered 2024-04-01: 5 mL

## 2024-04-01 NOTE — Progress Notes (Signed)
 Office Visit Note   Patient: Zachary Bates           Date of Birth: 15-Mar-1976           MRN: [id number] Visit Date: 04/01/2024 Requested by: No referring provider defined for this encounter. PCP: Patient, No Pcp Per  Subjective: Chief Complaint  Patient presents with   Right Knee - Follow-up    HPI: Zachary Bates is a 48 y.o. male who presents to the office reporting minimal right knee symptoms.  Patient had total knee replacement done several years ago.  Some question about whether or not the knee is infected.  He is doing well and working.  No fevers or chills.  Sed rate C-reactive protein a month ago were normal.  He has been off antibiotics for a month.  Denies any systemic symptoms of illness.                ROS: All systems reviewed are negative as they relate to the chief complaint within the history of present illness.  Patient denies fevers or chills.  Assessment & Plan: Visit Diagnoses:  1. Effusion of right knee     Plan: Impression is trace effusion in the right knee with excellent range of motion no warmth no lymphadenopathy and normal laboratory parameters for infection 4 weeks ago.  Plan is aspiration of the knee today.  15 cc removed.  Sent for Gram stain cell count as well as aerobic and anaerobic culture.  Clinically and from the laboratory standpoint was he does not have an infection in his knee.  I will call him with the lab results when they come in next week.  Follow-Up Instructions: No follow-ups on file.   Orders:  Orders Placed This Encounter  Procedures   Anaerobic and Aerobic Culture   Synovial Fluid Analysis, Complete   No orders of the defined types were placed in this encounter.     Procedures: Large Joint Inj: R knee on 04/01/2024 7:42 PM Indications: diagnostic evaluation, joint swelling and pain Details: 18 G 1.5 in needle, superolateral approach  Arthrogram: No  Medications: 5 mL lidocaine  1 % Outcome: tolerated well, no immediate  complications  15 cc fluid aspirated out of the right knee.  Sent for cell count Gram stain aerobic and anaerobic culture Procedure, treatment alternatives, risks and benefits explained, specific risks discussed. Consent was given by the patient. Immediately prior to procedure a time out was called to verify the correct patient, procedure, equipment, support staff and site/side marked as required. Patient was prepped and draped in the usual sterile fashion.       Clinical Data: No additional findings.  Objective: Vital Signs: There were no vitals taken for this visit.  Physical Exam:  Constitutional: Patient appears well-developed HEENT:  Head: Normocephalic Eyes:EOM are normal Neck: Normal range of motion Cardiovascular: Normal rate Pulmonary/chest: Effort normal Neurologic: Patient is alert Skin: Skin is warm Psychiatric: Patient has normal mood and affect  Ortho Exam: Ortho exam demonstrates excellent range of motion of the right knee.  No warmth to the knee.  Trace effusion is present.  No lymphadenopathy.  No masses skin lesions or other abnormalities noted around the skin of the knee.  Specialty Comments:  No specialty comments available.  Imaging: No results found.   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  Past Surgical History:  Procedure Laterality Date   CYST EXCISION Left    Left 5th phalange   IRRIGATION AND DEBRIDEMENT KNEE Right 12/06/2023   Procedure: IRRIGATION AND DEBRIDEMENT KNEE;  Surgeon: Addie Cordella Hamilton, MD;  Location: [facility] OR;  Service: Orthopedics;  Laterality: Right;   LAPAROSCOPIC GASTRIC BYPASS   2008   TOTAL KNEE ARTHROPLASTY Right 05/31/2020   Procedure: RIGHT TOTAL KNEE ARTHROPLASTY;  Surgeon: Addie Cordella Hamilton, MD;  Location: [facility] OR;  Service: Orthopedics;  Laterality: Right;   Social History   Occupational History   Not on file  Tobacco Use   Smoking status: Never   Smokeless tobacco: Never  Vaping Use   Vaping status: Never Used  Substance and Sexual Activity   Alcohol use: Not Currently   Drug use: Never   Sexual activity: Yes

## 2024-04-08 LAB — ANAEROBIC AND AEROBIC CULTURE
AER RESULT:: NO GROWTH
MICRO NUMBER:: 17077510
MICRO NUMBER:: 17077511
SPECIMEN QUALITY:: ADEQUATE
SPECIMEN QUALITY:: ADEQUATE

## 2024-04-08 LAB — SYNOVIAL FLUID ANALYSIS, COMPLETE
Basophils, %: 0 %
Eosinophils-Synovial: 0 % (ref 0–2)
Lymphocytes-Synovial Fld: 64 % (ref 0–74)
Monocyte/Macrophage: 0 % (ref 0–69)
Neutrophil, Synovial: 36 % — ABNORMAL HIGH (ref 0–24)
Synoviocytes, %: 0 % (ref 0–15)
WBC, Synovial: 810 {cells}/uL — ABNORMAL HIGH (ref <150)

## 2024-04-09 ENCOUNTER — Ambulatory Visit: Payer: Self-pay | Admitting: Orthopedic Surgery

## 2024-04-09 NOTE — Progress Notes (Signed)
 Agreed on that count from our end.  No concern for PJI especially with his symptom level

## 2024-04-09 NOTE — Progress Notes (Signed)
 Hi Dr. Fleeta Rothman, we were discussing Zachary Bates over messaging about 1 month ago.  Here are the synovial fluid aspiration results from recent aspiration by Dr. Addie.  No growth on culture

## 2024-04-09 NOTE — Progress Notes (Signed)
Here it is

## 2024-04-27 ENCOUNTER — Encounter: Payer: Self-pay | Admitting: Radiology
